# Patient Record
Sex: Female | Born: 1955
Health system: Southern US, Community
[De-identification: ages and names within clinical notes are randomized; demographics above are authoritative.]

## PROBLEM LIST (undated history)

## (undated) DIAGNOSIS — G4700 Insomnia, unspecified: Secondary | ICD-10-CM

## (undated) DIAGNOSIS — F419 Anxiety disorder, unspecified: Secondary | ICD-10-CM

## (undated) DIAGNOSIS — I10 Essential (primary) hypertension: Secondary | ICD-10-CM

## (undated) DIAGNOSIS — K635 Polyp of colon: Secondary | ICD-10-CM

## (undated) DIAGNOSIS — K219 Gastro-esophageal reflux disease without esophagitis: Secondary | ICD-10-CM

## (undated) DIAGNOSIS — R42 Dizziness and giddiness: Secondary | ICD-10-CM

## (undated) HISTORY — DX: Essential (primary) hypertension: I10

## (undated) HISTORY — PX: CHOLECYSTECTOMY: SHX55

## (undated) HISTORY — DX: Insomnia, unspecified: G47.00

## (undated) HISTORY — PX: ROTATOR CUFF REPAIR: SHX139

## (undated) HISTORY — DX: Gastro-esophageal reflux disease without esophagitis: K21.9

## (undated) HISTORY — DX: Anxiety disorder, unspecified: F41.9

## (undated) HISTORY — DX: Dizziness and giddiness: R42

## (undated) HISTORY — DX: Polyp of colon: K63.5

---

## 1999-01-17 HISTORY — PX: ABDOMINAL HYSTERECTOMY: SHX81

## 2011-04-01 ENCOUNTER — Ambulatory Visit (INDEPENDENT_AMBULATORY_CARE_PROVIDER_SITE_OTHER): Payer: BC Managed Care – PPO | Admitting: Internal Medicine

## 2011-04-01 ENCOUNTER — Encounter: Payer: Self-pay | Admitting: Internal Medicine

## 2011-04-01 DIAGNOSIS — R1013 Epigastric pain: Secondary | ICD-10-CM

## 2011-04-01 DIAGNOSIS — K3189 Other diseases of stomach and duodenum: Secondary | ICD-10-CM

## 2011-04-01 DIAGNOSIS — K219 Gastro-esophageal reflux disease without esophagitis: Secondary | ICD-10-CM | POA: Insufficient documentation

## 2011-04-01 DIAGNOSIS — F419 Anxiety disorder, unspecified: Secondary | ICD-10-CM | POA: Insufficient documentation

## 2011-04-01 DIAGNOSIS — G4700 Insomnia, unspecified: Secondary | ICD-10-CM | POA: Insufficient documentation

## 2011-04-01 DIAGNOSIS — K635 Polyp of colon: Secondary | ICD-10-CM | POA: Insufficient documentation

## 2011-04-01 DIAGNOSIS — R42 Dizziness and giddiness: Secondary | ICD-10-CM | POA: Insufficient documentation

## 2011-04-01 DIAGNOSIS — I10 Essential (primary) hypertension: Secondary | ICD-10-CM

## 2011-04-01 DIAGNOSIS — R197 Diarrhea, unspecified: Secondary | ICD-10-CM

## 2011-04-01 DIAGNOSIS — R110 Nausea: Secondary | ICD-10-CM

## 2011-04-01 MED ORDER — HYDROCHLOROTHIAZIDE 25 MG PO TABS: 25.0000 mg | ORAL_TABLET | Freq: Every day | ORAL | Status: DC

## 2011-04-01 MED ORDER — AMLODIPINE BESYLATE 5 MG PO TABS: 5.0000 mg | ORAL_TABLET | Freq: Every day | ORAL | Status: DC

## 2011-04-01 NOTE — Patient Instructions (Signed)
Eliminate dairy, milk, cheese, ice cream, creme cheese,  For the next 4 weeks Will change meds to Norvasc 5 mg and HCTZ 25 mg take each tab once a day RX to pharmacy  Labs will be mailed to you

## 2011-04-01 NOTE — Progress Notes (Signed)
Subjective:    Patient ID: Grace Vaughn, female    DOB: 07-07-1956, 55 y.o.   MRN: [id number]  HPI  New pt here for first visit.  Former pt of [employer].  Works in Consulting civil engineer at [employer].   Saw multiple MD's.  PMH of HTN, menopause, anxiety, GERD, vertigo, colon polyps, and insomnia.    She is concerned today about persistant nausea, occurs after eating but not all foods.sometimes associaated with epigastric cramping and diarrhea.  No abd pain except cramps, no fever.  Diarrhea comes and goes.  She does admit to a high dairy diet at [facility] on dairy days.  Also reports that years ago she had an EGD and was told her esophagus was narrow but it was never stretched.  She is under a lot of stress at work as a co-worker is out ill.  No blood or change in color of stools  She also states her Benicar/hctz is too expensive and would liket to switch.  Sheis intolerant of lisinopril an losartan.  No chest pain Sob, or edema  Allergies  Allergen Reactions  . Erythromycin Nausea And Vomiting  . Keflex Nausea And Vomiting   Past Medical History  Diagnosis Date  . GERD (gastroesophageal reflux disease)   . Hypertension    Past Surgical History  Procedure Date  . Abdominal hysterectomy   . Cesarean section 1986, 1989   History   Social History  . Marital Status: Married    Spouse Name: N/A    Number of Children: N/A  . Years of Education: N/A   Occupational History  . Not on file.   Social History Main Topics  . Smoking status: Never Smoker   . Smokeless tobacco: Never Used  . Alcohol Use: 1.5 oz/week    3 drink(s) per week  . Drug Use: No  . Sexually Active: Yes   Other Topics Concern  . Not on file   Social History Narrative  . No narrative on file   Family History  Problem Relation Age of Onset  . Breast cancer Mother   . Heart disease Mother     cardiomyopathy  . Heart disease Father     CHF   There is no problem list on file for this patient.  No current outpatient  prescriptions on file prior to visit.       Review of Systems    see HPI Objective:   Physical Exam Physical Exam  Nursing note and vitals reviewed.  Constitutional: She is oriented to person, place, and time. She appears well-developed and well-nourished.  HENT:  Head: Normocephalic and atraumatic.  Cardiovascular: Normal rate and regular rhythm. Exam reveals no gallop and no friction rub.  No murmur heard.  Pulmonary/Chest: Breath sounds normal. She has no wheezes. She has no rales. ABD:  BS+  No HSM or masses.  No peritoneal signs  Neurological: She is alert and oriented to person, place, and time.  Skin: Skin is warm and dry.  Psychiatric: She has a normal mood and affect. Her behavior is normal.       Assessment & Plan:  1) Nausea, diarhea, abd cramping.     Will try trial of strict dairy free diet for the next 4 weeks.   Contineu Prilosec.  If this does not work , will need repeat EGD and imaging.  Check labs today  2)  Htn  D/C  Benicar.  Rx:  Norvasc 5 mg #30 with 2 rf.  HCTZ  #  30 3) GERD 4) colon polyps 5) h/o vertigo insomnia

## 2011-04-02 LAB — COMPREHENSIVE METABOLIC PANEL
ALT: 13 U/L (ref 0–35)
AST: 17 U/L (ref 0–37)
Creat: 0.72 mg/dL (ref 0.50–1.10)
Sodium: 139 mEq/L (ref 135–145)
Total Bilirubin: 0.3 mg/dL (ref 0.3–1.2)
Total Protein: 7.2 g/dL (ref 6.0–8.3)

## 2011-04-02 LAB — CBC WITH DIFFERENTIAL/PLATELET
Basophils Absolute: 0.1 10*3/uL (ref 0.0–0.1)
Basophils Relative: 1 % (ref 0–1)
Eosinophils Absolute: 0.1 10*3/uL (ref 0.0–0.7)
Eosinophils Relative: 0 % (ref 0–5)
HCT: 38.8 % (ref 36.0–46.0)
MCH: 29.6 pg (ref 26.0–34.0)
MCHC: 33 g/dL (ref 30.0–36.0)
MCV: 89.8 fL (ref 78.0–100.0)
Monocytes Absolute: 0.8 10*3/uL (ref 0.1–1.0)
RDW: 13.6 % (ref 11.5–15.5)

## 2011-04-07 ENCOUNTER — Encounter: Payer: Self-pay | Admitting: Emergency Medicine

## 2011-04-29 ENCOUNTER — Ambulatory Visit (INDEPENDENT_AMBULATORY_CARE_PROVIDER_SITE_OTHER): Payer: BC Managed Care – PPO | Admitting: Internal Medicine

## 2011-04-29 DIAGNOSIS — G4700 Insomnia, unspecified: Secondary | ICD-10-CM

## 2011-04-29 DIAGNOSIS — R109 Unspecified abdominal pain: Secondary | ICD-10-CM

## 2011-04-29 DIAGNOSIS — I10 Essential (primary) hypertension: Secondary | ICD-10-CM

## 2011-04-29 MED ORDER — CLONAZEPAM 0.5 MG PO TABS: 0.5000 mg | ORAL_TABLET | Freq: Every evening | ORAL | Status: DC | PRN

## 2011-04-29 NOTE — Patient Instructions (Signed)
Will schedule abd and pelvic CT  Call office Wednesday for results  Call office when you check your BP meds

## 2011-04-29 NOTE — Progress Notes (Signed)
Subjective:    Patient ID: Grace Vaughn, female    DOB: [birth year], 55 y.o.   MRN: [id number]  HPI Grace Vaughn is here for follow up.  She has changed her diet to virtually dairy free and has avoided fried foods but still has episodes of nausea and occasional RUQ pain.  She has lost another 5 #.   No documented fever.    She could not tolerate the NOrvasc.  She had numbness and went back to Benicar.  Her numbness is being evaluated by Dr. Antonietta Barcelona.  She reports having a brain MRI which was OK per her report.  She is intolerant to many BP meds and one was causing chest pain  Allergies  Allergen Reactions  . Erythromycin Nausea And Vomiting  . Keflex Nausea And Vomiting  . Lisinopril Cough  . Losartan     Pain in extremities, paresthesias    Past Medical History  Diagnosis Date  . GERD (gastroesophageal reflux disease)   . Anxiety   . Vertigo   . Colon polyps   . Insomnia   . Hypertension    Past Surgical History  Procedure Date  . Cesarean section 1986, 1989  . Abdominal hysterectomy 01/1999   History   Social History  . Marital Status: Married    Spouse Name: N/A    Number of Children: N/A  . Years of Education: N/A   Occupational History  . Not on file.   Social History Main Topics  . Smoking status: Never Smoker   . Smokeless tobacco: Never Used  . Alcohol Use: 1.5 oz/week    3 drink(s) per week  . Drug Use: No  . Sexually Active: Yes   Other Topics Concern  . Not on file   Social History Narrative  . No narrative on file   Family History  Problem Relation Age of Onset  . Breast cancer Mother   . Heart disease Mother     cardiomyopathy  . Heart disease Father     CHF   Patient Active Problem List  Diagnoses  . GERD (gastroesophageal reflux disease)  . Hypertension  . Anxiety  . Vertigo  . Colon polyps  . Insomnia   Current Outpatient Prescriptions on File Prior to Visit  Medication Sig Dispense Refill  . buPROPion (WELLBUTRIN SR) 150 MG 12 hr  tablet Take 150 mg by mouth daily.        Marland Kitchen estradiol (ESTRACE) 0.5 MG tablet Take 0.5 mg by mouth daily.        . hydrochlorothiazide (HYDRODIURIL) 25 MG tablet Take 1 tablet (25 mg total) by mouth daily.  30 tablet  3  . omeprazole (PRILOSEC) 40 MG capsule Take 40 mg by mouth daily.        Marland Kitchen amLODipine (NORVASC) 5 MG tablet Take 1 tablet (5 mg total) by mouth daily.  30 tablet  3       Review of Systems See HPI    Objective:   Physical Exam Physical Exam  Nursing note and vitals reviewed.  Constitutional: She is oriented to person, place, and time. She appears well-developed and well-nourished.  HENT:  Head: Normocephalic and atraumatic.  Cardiovascular: Normal rate and regular rhythm. Exam reveals no gallop and no friction rub.  No murmur heard.  Pulmonary/Chest: Breath sounds normal. She has no wheezes. She has no rales. ABD:  Non distended BS +  Minimal tenderness RUQ only.  No peritoneal signs  Neurological: She is alert and oriented to person,  place, and time.  Skin: Skin is warm and dry.  Psychiatric: She has a normal mood and affect. Her behavior is normal.          Assessment & Plan:  1)  Abd pain, weight loss and nausea  Will get abd/pelvic CT and further Tx based on results.  If normal will need GI consult for endoscopy 2)  HTN:  Pt is to check what meds she has tried in the past  And  Call office before I change anything  Pt is to call me WEds after CT done  She voices understanding

## 2011-05-04 ENCOUNTER — Other Ambulatory Visit (HOSPITAL_BASED_OUTPATIENT_CLINIC_OR_DEPARTMENT_OTHER): Payer: BC Managed Care – PPO

## 2011-05-11 ENCOUNTER — Telehealth: Payer: Self-pay | Admitting: Internal Medicine

## 2011-05-11 DIAGNOSIS — R110 Nausea: Secondary | ICD-10-CM

## 2011-05-11 MED ORDER — PROMETHAZINE HCL 12.5 MG PO TABS: 12.5000 mg | ORAL_TABLET | Freq: Four times a day (QID) | ORAL | Status: DC | PRN

## 2011-05-11 NOTE — Telephone Encounter (Signed)
Spoke with pt . And informed of results.  Ct only shows diverticulosis.  Pt still has significant nausea.  Will refer to GI.  Pt requests Dr. Lanae Boast  Will also give low dose Phenergan q6h prn Counseled not to take if driving due to possible drowsiness

## 2011-05-11 NOTE — Progress Notes (Signed)
Grace Vaughn has an appointment with Dr. Letitia Libra at [facility] 05/17/11 @ 815am.  She is aware of appointment per A. Davis-dhp, rn

## 2011-05-19 ENCOUNTER — Encounter: Payer: Self-pay | Admitting: Emergency Medicine

## 2011-06-07 ENCOUNTER — Telehealth: Payer: Self-pay | Admitting: Emergency Medicine

## 2011-06-07 NOTE — Telephone Encounter (Signed)
Pt called to let you know she is scheduled for gallbladder surgery 06/16/11 with Dr. Darla Lesches [facility]).  They found gallstones on exam.  She is to have EKG done preop and will have them forward a copy to our office.

## 2011-07-27 ENCOUNTER — Other Ambulatory Visit: Payer: Self-pay | Admitting: Emergency Medicine

## 2011-07-27 DIAGNOSIS — G4700 Insomnia, unspecified: Secondary | ICD-10-CM

## 2011-07-27 MED ORDER — CLONAZEPAM 0.5 MG PO TABS: 0.5000 mg | ORAL_TABLET | Freq: Every evening | ORAL | Status: DC | PRN

## 2011-07-27 NOTE — Telephone Encounter (Signed)
Grace Vaughn called, requested refill on Clonazepam which she uses PRN for sleep.  She has been out for several weeks, is not resting well.  She made an appt to be seen for follow up on Thursday 07/28/10.  Called Rx to MD voicemail at [facility] on [address]

## 2011-07-29 ENCOUNTER — Encounter: Payer: Self-pay | Admitting: Internal Medicine

## 2011-07-29 ENCOUNTER — Ambulatory Visit (INDEPENDENT_AMBULATORY_CARE_PROVIDER_SITE_OTHER): Payer: BC Managed Care – PPO | Admitting: Internal Medicine

## 2011-07-29 DIAGNOSIS — R42 Dizziness and giddiness: Secondary | ICD-10-CM

## 2011-07-29 DIAGNOSIS — R110 Nausea: Secondary | ICD-10-CM | POA: Insufficient documentation

## 2011-07-29 MED ORDER — MECLIZINE HCL 25 MG PO TABS: 25.0000 mg | ORAL_TABLET | Freq: Three times a day (TID) | ORAL | Status: AC | PRN

## 2011-07-29 MED ORDER — PROMETHAZINE HCL 12.5 MG PO TABS: 12.5000 mg | ORAL_TABLET | Freq: Three times a day (TID) | ORAL | Status: DC | PRN

## 2011-07-29 NOTE — Progress Notes (Signed)
Subjective:    Patient ID: Grace Vaughn, female    DOB: 25-Nov-1955, 56 y.o.   MRN: [id number]  HPI  Grace Vaughn is here for follow up on nausea.  She has had lap  cholecystectomy in 11/27 Dr. Darla Lesches and had EGD by D.r Earlene Plater [facility] which essentially normal.  Bx for celiac were negative Gastric bx showed patchy inflammation.  She was counseled to take omeprazole bid per Dr. Earlene Plater  ,  She has not noticed much improvement in nausea but her RUQ pain has improved.  No vomiting but now more gassy and diarrhea post chole.  She is adjusting her diet to low fat and this helps some.    She has been on Wellbutrin and would like to come off.  She was under stress from parents illness and death and does not think she needs it now  She has been having spinning sensation very similar ot her vertigo in the past  She describes her work at [employer] as stressful.  Allergies  Allergen Reactions  . Erythromycin Nausea And Vomiting  . Keflex Nausea And Vomiting  . Lisinopril Cough  . Losartan     Pain in extremities, paresthesias    Past Medical History  Diagnosis Date  . GERD (gastroesophageal reflux disease)   . Anxiety   . Vertigo   . Colon polyps   . Insomnia   . Hypertension    Past Surgical History  Procedure Date  . Cesarean section 1986, 1989  . Abdominal hysterectomy 01/1999   History   Social History  . Marital Status: Married    Spouse Name: N/A    Number of Children: N/A  . Years of Education: N/A   Occupational History  . Not on file.   Social History Main Topics  . Smoking status: Never Smoker   . Smokeless tobacco: Never Used  . Alcohol Use: 1.5 oz/week    3 drink(s) per week  . Drug Use: No  . Sexually Active: Yes   Other Topics Concern  . Not on file   Social History Narrative  . No narrative on file   Family History  Problem Relation Age of Onset  . Breast cancer Mother   . Heart disease Mother     cardiomyopathy  . Heart disease Father     CHF    Patient Active Problem List  Diagnoses  . GERD (gastroesophageal reflux disease)  . Hypertension  . Anxiety  . Vertigo  . Colon polyps  . Insomnia  . Nausea alone   Current Outpatient Prescriptions on File Prior to Visit  Medication Sig Dispense Refill  . buPROPion (WELLBUTRIN SR) 150 MG 12 hr tablet Take 150 mg by mouth daily.        . clonazePAM (KLONOPIN) 0.5 MG tablet Take 1 tablet (0.5 mg total) by mouth at bedtime as needed.  30 tablet  1  . estradiol (ESTRACE) 0.5 MG tablet Take 0.5 mg by mouth daily.        Marland Kitchen omeprazole (PRILOSEC) 40 MG capsule Take 40 mg by mouth daily.              Review of Systems    see HPI Objective:   Physical Exam Physical Exam  Nursing note and vitals reviewed.  Constitutional: She is oriented to person, place, and time. She appears well-developed and well-nourished.  HENT:  Head: Normocephalic and atraumatic.  Tm"S  Minimal serous effusion. Cardiovascular: Normal rate and regular rhythm. Exam reveals no  gallop and no friction rub.  No murmur heard.  Pulmonary/Chest: Breath sounds normal. She has no wheezes. She has no rales.  ABD:  Lap chole incision healing well.  BS.  Nondistended  nontender except mild tenderness aat incision sites Neurological: She is alert and oriented to person, place, and time.  Skin: Skin is warm and dry.  Psychiatric: She has a normal mood and affect. Her behavior is normal. Neurologic nonc focal  Cn II - XII intact.  Motor 5/5  Reflexes 2+ symmetric        Assessment & Plan:  Nausea  Etiology unclear.  Will try empiric taper off WEllbutrin  Take 150 mg qod for 10 days then stop. Po low dose Phernergan 12.5 mg po 2) Post chole diarrhea  Low fat diet OK to take immordium prn 3)  Vertigo  Antivert q8h prn

## 2011-07-29 NOTE — Patient Instructions (Signed)
Taper downward off Wellburtirn as instructed  Phenergan as needed  Antivert as needed  Call if dizziness is worse

## 2011-09-08 ENCOUNTER — Ambulatory Visit (INDEPENDENT_AMBULATORY_CARE_PROVIDER_SITE_OTHER): Payer: BC Managed Care – PPO | Admitting: Internal Medicine

## 2011-09-08 ENCOUNTER — Other Ambulatory Visit: Payer: Self-pay | Admitting: Internal Medicine

## 2011-09-08 ENCOUNTER — Encounter: Payer: Self-pay | Admitting: Internal Medicine

## 2011-09-08 DIAGNOSIS — N649 Disorder of breast, unspecified: Secondary | ICD-10-CM

## 2011-09-08 DIAGNOSIS — Z85828 Personal history of other malignant neoplasm of skin: Secondary | ICD-10-CM

## 2011-09-08 MED ORDER — AMOXICILLIN-POT CLAVULANATE 875-125 MG PO TABS: 1.0000 | ORAL_TABLET | Freq: Two times a day (BID) | ORAL | Status: AC

## 2011-09-08 NOTE — Progress Notes (Signed)
Subjective:    Patient ID: Grace Vaughn, female    DOB: 1956/07/08, 56 y.o.   MRN: [id number]  HPI  Alexia Freestone is here with an acute concern.  She has a skin lesion on her L breast present for 3 weeks.  Started when she scratched her breast and noted wet fingers  No purulence noted.  She denies tingling paresthesia, itching or pain.  Has not healed and this concerns her.  Mother had breast CA.    She also has a history of non melanoma skin  CA per her report.    She also report she feels as if she has a sinus infection.  Nasal stufffness, green discharge, pain in frontal sinus.  NO fever  Allergies  Allergen Reactions  . Augmentin (Amoxicillin-Pot Clavulanate)   . Erythromycin Nausea And Vomiting  . Keflex Nausea And Vomiting  . Lisinopril Cough  . Losartan     Pain in extremities, paresthesias    Past Medical History  Diagnosis Date  . GERD (gastroesophageal reflux disease)   . Anxiety   . Vertigo   . Colon polyps   . Insomnia   . Hypertension    Past Surgical History  Procedure Date  . Cesarean section 1986, 1989  . Abdominal hysterectomy 01/1999   History   Social History  . Marital Status: Married    Spouse Name: N/A    Number of Children: N/A  . Years of Education: N/A   Occupational History  . Not on file.   Social History Main Topics  . Smoking status: Never Smoker   . Smokeless tobacco: Never Used  . Alcohol Use: 1.5 oz/week    3 drink(s) per week  . Drug Use: No  . Sexually Active: Yes   Other Topics Concern  . Not on file   Social History Narrative  . No narrative on file   Family History  Problem Relation Age of Onset  . Breast cancer Mother   . Heart disease Mother     cardiomyopathy  . Heart disease Father     CHF   Patient Active Problem List  Diagnoses  . GERD (gastroesophageal reflux disease)  . Hypertension  . Anxiety  . Vertigo  . Colon polyps  . Insomnia  . Nausea alone   Current Outpatient Prescriptions on File Prior to  Visit  Medication Sig Dispense Refill  . buPROPion (WELLBUTRIN SR) 150 MG 12 hr tablet Take 150 mg by mouth daily.        . clonazePAM (KLONOPIN) 0.5 MG tablet Take 1 tablet (0.5 mg total) by mouth at bedtime as needed.  30 tablet  1  . estradiol (ESTRACE) 0.5 MG tablet Take 0.5 mg by mouth daily.        Marland Kitchen olmesartan-hydrochlorothiazide (BENICAR HCT) 40-25 MG per tablet Take 1 tablet by mouth daily.      Marland Kitchen omeprazole (PRILOSEC) 40 MG capsule Take 40 mg by mouth 2 (two) times daily.               Review of Systems    see HPI Objective:   Physical Exam Physical Exam  Constitutional: She is oriented to person, place, and time. She appears well-developed and well-nourished. She is cooperative.  HENT:  Head: Normocephalic and atraumatic.  Right Ear: A middle ear effusion is present.  Left Ear: A middle ear effusion is present.  Nose: Mucosal edema present. Right sinus exhibits maxillary sinus tenderness. Left sinus exhibits maxillary sinus tenderness.  Mouth/Throat: Posterior  oropharyngeal erythema present.  Serous effusion bilaterally  Eyes: Conjunctivae and EOM are normal. Pupils are equal, round, and reactive to light.  Neck: Neck supple. Carotid bruit is not present. No mass present.  Cardiovascular: Regular rhythm, normal heart sounds, intact distal pulses and normal pulses. Exam reveals no gallop and no friction rub.  No murmur heard.  Pulmonary/Chest: Breath sounds normal. She has no wheezes. She has no rhonchi. She has no rales.   Breast exam.  No nipple discharge , no discrete masses no axillary adenopathy bilaterally.  She has a singular  0.5 cm nummular shaped scab medial lower L breast.  No purulence.  No mass palpated under area. Neurological: She is alert and oriented to person, place, and time.  Skin: Skin is warm and dry. No abrasion, no bruising, no ecchymosis and no rash noted. No cyanosis. Nails show no clubbing.  Psychiatric: She has a normal mood and affect. Her  speech is normal and behavior is normal.        Assessment & Plan:  1) Skin lesion L breast.  Will give 10 day course of augmentin to be taken with food to avoid nausea.  Will also get diagnostic mammogram.  Clinically does not fit a picture of zoster.  She is to see me in 4 weeks.  She voices understanding 2)   Sinusitis  See above 3)  History of skin ca  Has not seen a dermatologist for skin surveillance.  Will refer to Dr. Danella Deis

## 2011-09-08 NOTE — Patient Instructions (Signed)
To have diagnostic mammogram will be scheduled  Take antibiotic as prescribed  See me in 4 weeks  Will set up referral to dermatolgy

## 2011-09-10 ENCOUNTER — Encounter: Payer: Self-pay | Admitting: Internal Medicine

## 2011-09-16 ENCOUNTER — Ambulatory Visit
Admission: RE | Admit: 2011-09-16 | Discharge: 2011-09-16 | Disposition: A | Discharge status: Home / Self Care | Payer: BC Managed Care – PPO | Source: Ambulatory Visit | Attending: Internal Medicine | Admitting: Internal Medicine

## 2011-09-16 DIAGNOSIS — N649 Disorder of breast, unspecified: Secondary | ICD-10-CM

## 2011-10-07 ENCOUNTER — Ambulatory Visit: Payer: BC Managed Care – PPO | Admitting: Internal Medicine

## 2012-07-25 ENCOUNTER — Telehealth: Payer: Self-pay | Admitting: *Deleted

## 2012-08-03 NOTE — Telephone Encounter (Signed)
Verified meds.

## 2012-08-22 ENCOUNTER — Telehealth: Payer: Self-pay | Admitting: Internal Medicine

## 2012-08-22 NOTE — Telephone Encounter (Signed)
Pt states she is having chills, body aches, diarrhea, headaches in the middle... Pt would like to know what she should do... Pt would like to know if a nurse can call her at home [phone]...   Pt has appointment on Thursday afternoon.Marland Kitchen

## 2012-08-22 NOTE — Telephone Encounter (Signed)
Advised pt to go to UC as well as keep her appt. On Thur

## 2012-08-24 ENCOUNTER — Ambulatory Visit: Payer: BC Managed Care – PPO | Admitting: Internal Medicine

## 2012-08-29 ENCOUNTER — Ambulatory Visit (INDEPENDENT_AMBULATORY_CARE_PROVIDER_SITE_OTHER): Payer: BC Managed Care – PPO | Admitting: Internal Medicine

## 2012-08-29 ENCOUNTER — Encounter: Payer: Self-pay | Admitting: Internal Medicine

## 2012-08-29 VITALS — BP 135/82 | HR 85 | Temp 97.7°F | Resp 18 | Wt 189.0 lb

## 2012-08-29 DIAGNOSIS — K5289 Other specified noninfective gastroenteritis and colitis: Secondary | ICD-10-CM

## 2012-08-29 DIAGNOSIS — R51 Headache: Secondary | ICD-10-CM

## 2012-08-29 DIAGNOSIS — R519 Headache, unspecified: Secondary | ICD-10-CM | POA: Insufficient documentation

## 2012-08-29 DIAGNOSIS — R110 Nausea: Secondary | ICD-10-CM

## 2012-08-29 LAB — POCT URINALYSIS DIPSTICK
Bilirubin, UA: NEGATIVE
Blood, UA: NEGATIVE
Glucose, UA: NEGATIVE
Ketones, UA: NEGATIVE
Spec Grav, UA: 1.015
Urobilinogen, UA: NEGATIVE

## 2012-08-29 MED ORDER — RIZATRIPTAN BENZOATE 10 MG PO TBDP: 10.0000 mg | ORAL_TABLET | ORAL | Status: DC | PRN

## 2012-08-29 NOTE — Patient Instructions (Addendum)
See me in two weeks 

## 2012-08-29 NOTE — Progress Notes (Signed)
Subjective:    Patient ID: Grace Vaughn, female    DOB: 1956-06-09, 57 y.o.   MRN: [id number]  HPI Grace Vaughn is here for acute visit.   She reports 2 week history of frontal headache and Pain "behind my R eye"   Associated with nausea and photophobia.  No vomiting  Wants "to sleep it off"   No FHof migraines.    On 2/4  She was seen at [facility] urgent care for gastroenteritis.  Had severe vomiting and diarrhea.  Diagnsoed with infectious gastroenteritis and given phenergan, ultram for pain and Cipro.  She has just a few Cipro left.  Vomiting and diarrhea has subsided.   Exposed to husband with similar GI illness  What persists is nausea an dull headache that occurs in am over the last two days  Allergies  Allergen Reactions  . Augmentin (Amoxicillin-Pot Clavulanate)   . Cephalexin Nausea And Vomiting  . Erythromycin Nausea And Vomiting  . Lisinopril Cough  . Losartan     Pain in extremities, paresthesias    Past Medical History  Diagnosis Date  . GERD (gastroesophageal reflux disease)   . Anxiety   . Vertigo   . Colon polyps   . Insomnia   . Hypertension    Past Surgical History  Procedure Laterality Date  . Cesarean section  1986, 1989  . Abdominal hysterectomy  01/1999   History   Social History  . Marital Status: Married    Spouse Name: N/A    Number of Children: N/A  . Years of Education: N/A   Occupational History  . Not on file.   Social History Main Topics  . Smoking status: Never Smoker   . Smokeless tobacco: Never Used  . Alcohol Use: 1.5 oz/week    3 drink(s) per week  . Drug Use: No  . Sexually Active: Yes   Other Topics Concern  . Not on file   Social History Narrative  . No narrative on file   Family History  Problem Relation Age of Onset  . Breast cancer Mother   . Heart disease Mother     cardiomyopathy  . Heart disease Father     CHF   Patient Active Problem List  Diagnosis  . GERD (gastroesophageal reflux disease)  .  Hypertension  . Anxiety  . Vertigo  . Colon polyps  . Insomnia  . Nausea alone  . History of skin cancer  . Headache   Current Outpatient Prescriptions on File Prior to Visit  Medication Sig Dispense Refill  . buPROPion (WELLBUTRIN SR) 150 MG 12 hr tablet Take 150 mg by mouth daily.        Marland Kitchen estradiol (ESTRACE) 0.5 MG tablet Take 0.5 mg by mouth daily.        Marland Kitchen olmesartan-hydrochlorothiazide (BENICAR HCT) 40-25 MG per tablet Take 1 tablet by mouth daily.      Marland Kitchen omeprazole (PRILOSEC) 40 MG capsule Take 40 mg by mouth 2 (two) times daily.       . clonazePAM (KLONOPIN) 0.5 MG tablet Take 1 tablet (0.5 mg total) by mouth at bedtime as needed.  30 tablet  1  . promethazine (PHENERGAN) 12.5 MG tablet Take 1 tablet (12.5 mg total) by mouth every 6 (six) hours as needed for nausea.  30 tablet  0  . promethazine (PHENERGAN) 12.5 MG tablet Take 1 tablet (12.5 mg total) by mouth every 8 (eight) hours as needed for nausea.  10 tablet  0   No current  facility-administered medications on file prior to visit.       Review of Systems See HPI    Objective:   Physical Exam  Physical Exam  Nursing note and vitals reviewed.  Constitutional: She is oriented to person, place, and time. She appears well-developed and well-nourished.  HENT:  Head: Normocephalic and atraumatic.  Cardiovascular: Normal rate and regular rhythm. Exam reveals no gallop and no friction rub.  No murmur heard.  Pulmonary/Chest: Breath sounds normal. She has no wheezes. She has no rales.  Neurological: She is alert and oriented to person, place, and time. CNII-XII intact Motor 5/5 UE and LE Reflexes 2+ intact Romberg neg Cerebellar intact to FTN Sensory intact to pinprick  Skin: Skin is warm and dry.  Psychiatric: She has a normal mood and affect. Her behavior is normal.         Assessment & Plan:  Headache:  Clinically consistant with migraine syndrome.   Will try Maxalt 10 mg odt.  Counseled of SE profile  OK  to repeat in 2 hours.  She is to see me in two weeks.    Recent Gastroenteritis:  Resolving  Persistant nausea  See above.  Return in two weeks or sooner as needed

## 2012-09-27 ENCOUNTER — Ambulatory Visit (INDEPENDENT_AMBULATORY_CARE_PROVIDER_SITE_OTHER): Payer: BC Managed Care – PPO | Admitting: Internal Medicine

## 2012-09-27 ENCOUNTER — Ambulatory Visit (HOSPITAL_BASED_OUTPATIENT_CLINIC_OR_DEPARTMENT_OTHER)
Admission: RE | Admit: 2012-09-27 | Discharge: 2012-09-27 | Disposition: A | Discharge status: Home / Self Care | Payer: BC Managed Care – PPO | Source: Ambulatory Visit | Attending: Internal Medicine | Admitting: Internal Medicine

## 2012-09-27 ENCOUNTER — Encounter: Payer: Self-pay | Admitting: Internal Medicine

## 2012-09-27 VITALS — BP 128/76 | HR 97 | Temp 97.0°F | Resp 16 | Ht 64.0 in | Wt 189.0 lb

## 2012-09-27 DIAGNOSIS — Z1231 Encounter for screening mammogram for malignant neoplasm of breast: Secondary | ICD-10-CM | POA: Insufficient documentation

## 2012-09-27 DIAGNOSIS — R51 Headache: Secondary | ICD-10-CM

## 2012-09-27 DIAGNOSIS — K635 Polyp of colon: Secondary | ICD-10-CM

## 2012-09-27 DIAGNOSIS — Z139 Encounter for screening, unspecified: Secondary | ICD-10-CM

## 2012-09-27 DIAGNOSIS — I10 Essential (primary) hypertension: Secondary | ICD-10-CM

## 2012-09-27 DIAGNOSIS — K219 Gastro-esophageal reflux disease without esophagitis: Secondary | ICD-10-CM

## 2012-09-27 DIAGNOSIS — F419 Anxiety disorder, unspecified: Secondary | ICD-10-CM

## 2012-09-27 DIAGNOSIS — D126 Benign neoplasm of colon, unspecified: Secondary | ICD-10-CM

## 2012-09-27 DIAGNOSIS — F411 Generalized anxiety disorder: Secondary | ICD-10-CM

## 2012-09-27 DIAGNOSIS — R197 Diarrhea, unspecified: Secondary | ICD-10-CM

## 2012-09-27 LAB — COMPREHENSIVE METABOLIC PANEL
BUN: 24 mg/dL — ABNORMAL HIGH (ref 6–23)
CO2: 27 mEq/L (ref 19–32)
Calcium: 9.3 mg/dL (ref 8.4–10.5)
Chloride: 102 mEq/L (ref 96–112)
Creat: 1.17 mg/dL — ABNORMAL HIGH (ref 0.50–1.10)
Total Bilirubin: 0.2 mg/dL — ABNORMAL LOW (ref 0.3–1.2)

## 2012-09-27 LAB — LIPID PANEL
Cholesterol: 189 mg/dL (ref 0–200)
HDL: 65 mg/dL (ref >39)
Total CHOL/HDL Ratio: 2.9 Ratio
VLDL: 28 mg/dL (ref 0–40)

## 2012-09-27 LAB — POCT URINALYSIS DIPSTICK
Bilirubin, UA: NEGATIVE
Blood, UA: NEGATIVE
Glucose, UA: NEGATIVE
Leukocytes, UA: NEGATIVE
Nitrite, UA: NEGATIVE
Urobilinogen, UA: NEGATIVE
pH, UA: 5.5

## 2012-09-27 LAB — CBC WITH DIFFERENTIAL/PLATELET
Eosinophils Relative: 1 % (ref 0–5)
Hemoglobin: 11.4 g/dL — ABNORMAL LOW (ref 12.0–15.0)
Lymphocytes Relative: 26 % (ref 12–46)
Lymphs Abs: 2.8 10*3/uL (ref 0.7–4.0)
MCH: 29 pg (ref 26.0–34.0)
MCV: 85.8 fL (ref 78.0–100.0)
Monocytes Relative: 8 % (ref 3–12)
Platelets: 356 10*3/uL (ref 150–400)
RBC: 3.93 MIL/uL (ref 3.87–5.11)
WBC: 11 10*3/uL — ABNORMAL HIGH (ref 4.0–10.5)

## 2012-09-28 ENCOUNTER — Encounter: Payer: Self-pay | Admitting: Internal Medicine

## 2012-09-28 LAB — TSH: TSH: 2.759 u[IU]/mL (ref 0.350–4.500)

## 2012-09-28 LAB — GLIA (IGA/G) + TTG IGA: Gliadin IgA: 5 U/mL (ref <20)

## 2012-09-28 NOTE — Progress Notes (Signed)
Subjective:    Patient ID: Grace Vaughn, female    DOB: 27-Feb-1956, 57 y.o.   MRN: [id number]  HPI  Grace Vaughn is here for CPE.  She still has problems with chronic diarrhea and occasional nausea.  She did see a GI MD in [place] named Dr. Earlene Vaughn that she did not like.  She apparantly had an upper endoscopy  I do not have report. Per pt reports the upper endoscopy was OK  She does report she gets nauseated when she is anxious or has to perform publicly  Allergies  Allergen Reactions  . Augmentin (Amoxicillin-Pot Clavulanate)   . Cephalexin Nausea And Vomiting  . Erythromycin Nausea And Vomiting  . Lisinopril Cough  . Losartan     Pain in extremities, paresthesias    Past Medical History  Diagnosis Date  . GERD (gastroesophageal reflux disease)   . Anxiety   . Vertigo   . Colon polyps   . Insomnia   . Hypertension    Past Surgical History  Procedure Laterality Date  . Cesarean section  1986, 1989  . Abdominal hysterectomy  01/1999   History   Social History  . Marital Status: Married    Spouse Name: N/A    Number of Children: N/A  . Years of Education: N/A   Occupational History  . Not on file.   Social History Main Topics  . Smoking status: Never Smoker   . Smokeless tobacco: Never Used  . Alcohol Use: 1.5 oz/week    3 drink(s) per week  . Drug Use: No  . Sexually Active: Yes   Other Topics Concern  . Not on file   Social History Narrative  . No narrative on file   Family History  Problem Relation Age of Onset  . Breast cancer Mother   . Heart disease Mother     cardiomyopathy  . Heart disease Father     CHF   Patient Active Problem List  Diagnosis  . GERD (gastroesophageal reflux disease)  . Hypertension  . Anxiety  . Vertigo  . Colon polyps  . Insomnia  . Nausea alone  . History of skin cancer  . Headache   Current Outpatient Prescriptions on File Prior to Visit  Medication Sig Dispense Refill  . buPROPion (WELLBUTRIN SR) 150 MG 12  hr tablet Take 150 mg by mouth daily.        . clonazePAM (KLONOPIN) 0.5 MG tablet Take 1 tablet (0.5 mg total) by mouth at bedtime as needed.  30 tablet  1  . estradiol (ESTRACE) 0.5 MG tablet Take 0.5 mg by mouth daily.        Marland Kitchen olmesartan-hydrochlorothiazide (BENICAR HCT) 40-25 MG per tablet Take 1 tablet by mouth daily.      Marland Kitchen omeprazole (PRILOSEC) 40 MG capsule Take 40 mg by mouth 2 (two) times daily.       . promethazine (PHENERGAN) 25 MG tablet Take 25 mg by mouth every 6 (six) hours as needed for nausea.      . rizatriptan (MAXALT-MLT) 10 MG disintegrating tablet Take 1 tablet (10 mg total) by mouth as needed for migraine. May repeat in 2 hours if needed  10 tablet  0   No current facility-administered medications on file prior to visit.     Review of Systems    see HPI Objective:   Physical Exam  Physical Exam  Nursing note and vitals reviewed.  Constitutional: She is oriented to person, place, and time. She appears  well-developed and well-nourished.  HENT:  Head: Normocephalic and atraumatic.  Right Ear: Tympanic membrane and ear canal normal. No drainage. Tympanic membrane is not injected and not erythematous.  Left Ear: Tympanic membrane and ear canal normal. No drainage. Tympanic membrane is not injected and not erythematous.  Nose: Nose normal. Right sinus exhibits no maxillary sinus tenderness and no frontal sinus tenderness. Left sinus exhibits no maxillary sinus tenderness and no frontal sinus tenderness.  Mouth/Throat: Oropharynx is clear and moist. No oral lesions. No oropharyngeal exudate.  Eyes: Conjunctivae and EOM are normal. Pupils are equal, round, and reactive to light.  Neck: Normal range of motion. Neck supple. No JVD present. Carotid bruit is not present. No mass and no thyromegaly present.  Cardiovascular: Normal rate, regular rhythm, S1 normal, S2 normal and intact distal pulses. Exam reveals no gallop and no friction rub.  No murmur heard.  Pulses:   Carotid pulses are 2+ on the right side, and 2+ on the left side.  Dorsalis pedis pulses are 2+ on the right side, and 2+ on the left side.  No carotid bruit. No LE edema  Pulmonary/Chest: Breath sounds normal. She has no wheezes. She has no rales. She exhibits no tenderness.  Breasts no discrete masses no axillary adenpopathy no nipple discharge bilaterally Abdominal: Soft. Bowel sounds are normal. She exhibits no distension and no mass. There is no hepatosplenomegaly. There is no tenderness. There is no CVA tenderness.  Rectal no mass guaiac neg   Musculoskeletal: Normal range of motion.  No active synovitis to joints.  Lymphadenopathy:  She has no cervical adenopathy.  She has no axillary adenopathy.  Right: No inguinal and no supraclavicular adenopathy present.  Left: No inguinal and no supraclavicular adenopathy present.  Neurological: She is alert and oriented to person, place, and time. She has normal strength and normal reflexes. She displays no tremor. No cranial nerve deficit or sensory deficit. Coordination and gait normal.  Skin: Skin is warm and dry. No rash noted. No cyanosis. Nails show no clubbing.  Psychiatric: She has a normal mood and affect. Her speech is normal and behavior is normal. Cognition and memory are normal.         Assessment & Plan:  Health Maintenance  See scanned sheet.   HTN  Continue meds   Nausea and diarrhea  Pt counseled to follow up with [place] [facility]  Anxiety continue WEllbutrin and klonopin prn  Genella Rife  Continue Prilosec  History of colon polyps

## 2012-09-28 NOTE — Patient Instructions (Addendum)
See me as needed 

## 2012-10-04 ENCOUNTER — Telehealth: Payer: Self-pay | Admitting: Internal Medicine

## 2012-10-04 MED ORDER — POTASSIUM CHLORIDE CRYS ER 10 MEQ PO TBCR: EXTENDED_RELEASE_TABLET | ORAL | Status: DC

## 2012-10-04 NOTE — Telephone Encounter (Signed)
Spoke with pt and informed of lab rsults  Will replace oral K and pt is to see me in 2 weeks. Advised to drink plenty of liquids and I will  rechedk bun/creat

## 2012-11-23 ENCOUNTER — Telehealth: Payer: Self-pay | Admitting: Internal Medicine

## 2012-11-23 NOTE — Telephone Encounter (Signed)
Grace Vaughn  Pt was to see me in office after I gave her replacement K  Please call pt and give her an office visit appt .  Message back with date

## 2012-11-23 NOTE — Telephone Encounter (Signed)
Pt will make an appt on Friday 5/09

## 2012-11-29 ENCOUNTER — Ambulatory Visit (INDEPENDENT_AMBULATORY_CARE_PROVIDER_SITE_OTHER): Payer: PRIVATE HEALTH INSURANCE | Admitting: Internal Medicine

## 2012-11-29 ENCOUNTER — Encounter: Payer: Self-pay | Admitting: Internal Medicine

## 2012-11-29 VITALS — BP 126/79 | HR 84 | Temp 97.4°F | Resp 18 | Wt 192.0 lb

## 2012-11-29 DIAGNOSIS — E876 Hypokalemia: Secondary | ICD-10-CM

## 2012-11-29 DIAGNOSIS — R110 Nausea: Secondary | ICD-10-CM

## 2012-11-29 LAB — COMPREHENSIVE METABOLIC PANEL
BUN: 18 mg/dL (ref 6–23)
CO2: 29 mEq/L (ref 19–32)
Calcium: 10.2 mg/dL (ref 8.4–10.5)
Chloride: 99 mEq/L (ref 96–112)
Creat: 0.95 mg/dL (ref 0.50–1.10)
Total Bilirubin: 0.3 mg/dL (ref 0.3–1.2)

## 2012-11-29 NOTE — Patient Instructions (Addendum)
Call for lab results tommorrow

## 2012-11-29 NOTE — Progress Notes (Signed)
Subjective:    Patient ID: Grace Vaughn, female    DOB: 03/02/1956, 57 y.o.   MRN: [id number]  HPI  Grace Vaughn is here for follow up.  She tells me her diarrhea and nausea has markedly improved.   She has changed her diet to avoid dairy and fat and she thinks this has made a difference  I gave her a short course of K for replacement.  She is off K now for several weeks  Allergies  Allergen Reactions  . Augmentin (Amoxicillin-Pot Clavulanate)   . Cephalexin Nausea And Vomiting  . Erythromycin Nausea And Vomiting  . Lisinopril Cough  . Losartan     Pain in extremities, paresthesias    Past Medical History  Diagnosis Date  . GERD (gastroesophageal reflux disease)   . Anxiety   . Vertigo   . Colon polyps   . Insomnia   . Hypertension    Past Surgical History  Procedure Laterality Date  . Cesarean section  1986, 1989  . Abdominal hysterectomy  01/1999   History   Social History  . Marital Status: Married    Spouse Name: N/A    Number of Children: N/A  . Years of Education: N/A   Occupational History  . Not on file.   Social History Main Topics  . Smoking status: Never Smoker   . Smokeless tobacco: Never Used  . Alcohol Use: 1.5 oz/week    3 drink(s) per week  . Drug Use: No  . Sexually Active: Yes   Other Topics Concern  . Not on file   Social History Narrative  . No narrative on file   Family History  Problem Relation Age of Onset  . Breast cancer Mother   . Heart disease Mother     cardiomyopathy  . Heart disease Father     CHF   Patient Active Problem List   Diagnosis Date Noted  . Hypokalemia 11/29/2012  . Headache 08/29/2012  . History of skin cancer 09/08/2011  . Nausea alone 07/29/2011  . GERD (gastroesophageal reflux disease)   . Hypertension   . Anxiety   . Vertigo   . Colon polyps   . Insomnia    Current Outpatient Prescriptions on File Prior to Visit  Medication Sig Dispense Refill  . buPROPion (WELLBUTRIN SR) 150 MG 12 hr tablet  Take 150 mg by mouth daily.        . clonazePAM (KLONOPIN) 0.5 MG tablet Take 1 tablet (0.5 mg total) by mouth at bedtime as needed.  30 tablet  1  . estradiol (ESTRACE) 0.5 MG tablet Take 0.5 mg by mouth daily.        Marland Kitchen olmesartan-hydrochlorothiazide (BENICAR HCT) 40-25 MG per tablet Take 1 tablet by mouth daily.      Marland Kitchen omeprazole (PRILOSEC) 40 MG capsule Take 40 mg by mouth 2 (two) times daily.       . promethazine (PHENERGAN) 25 MG tablet Take 25 mg by mouth every 6 (six) hours as needed for nausea.      . rizatriptan (MAXALT-MLT) 10 MG disintegrating tablet Take 1 tablet (10 mg total) by mouth as needed for migraine. May repeat in 2 hours if needed  10 tablet  0  . potassium chloride (K-DUR,KLOR-CON) 10 MEQ tablet Take two tablets for one week then one tablet daily  30 tablet  1   No current facility-administered medications on file prior to visit.       Review of Systems See HPI  Objective:   Physical Exam Physical Exam  Nursing note and vitals reviewed.  Constitutional: She is oriented to person, place, and time. She appears well-developed and well-nourished.  HENT:  Head: Normocephalic and atraumatic.  Cardiovascular: Normal rate and regular rhythm. Exam reveals no gallop and no friction rub.  No murmur heard.  Pulmonary/Chest: Breath sounds normal. She has no wheezes. She has no rales.  Neurological: She is alert and oriented to person, place, and time.  Skin: Skin is warm and dry.  Psychiatric: She has a normal mood and affect. Her behavior is normal.             Assessment & Plan:  Hypokalemia  Will check today  Diarrhea/Nausea:  Improved with dietary change

## 2012-11-30 ENCOUNTER — Encounter: Payer: Self-pay | Admitting: *Deleted

## 2012-11-30 NOTE — Progress Notes (Signed)
Called Grace Vaughn and notified her potassium was fine, does not need to take anymore. Patient veralizes understanding.

## 2013-01-29 ENCOUNTER — Other Ambulatory Visit: Payer: Self-pay | Admitting: *Deleted

## 2013-01-29 MED ORDER — OLMESARTAN MEDOXOMIL-HCTZ 40-25 MG PO TABS: 1.0000 | ORAL_TABLET | Freq: Every day | ORAL | Status: DC

## 2013-01-29 NOTE — Telephone Encounter (Signed)
Patient called needs her Benicar HCT 40-25 mg 1/day filled.  She was originally prescribed by her prior doctor before she switched to Dr Kathie Rhodes.  She is on her last pill.  Her pharmacy is [facility]. If you need to talk to her call her at work [phone] ext [phone]

## 2013-01-29 NOTE — Telephone Encounter (Signed)
Refill request

## 2013-04-23 ENCOUNTER — Other Ambulatory Visit: Payer: Self-pay | Admitting: *Deleted

## 2013-04-23 MED ORDER — OLMESARTAN MEDOXOMIL-HCTZ 40-25 MG PO TABS: 1.0000 | ORAL_TABLET | Freq: Every day | ORAL | Status: DC

## 2013-04-23 NOTE — Telephone Encounter (Signed)
Refill request

## 2013-09-19 ENCOUNTER — Ambulatory Visit (HOSPITAL_BASED_OUTPATIENT_CLINIC_OR_DEPARTMENT_OTHER)
Admission: RE | Admit: 2013-09-19 | Discharge: 2013-09-19 | Disposition: A | Discharge status: Home / Self Care | Payer: 59 | Source: Ambulatory Visit | Attending: Internal Medicine | Admitting: Internal Medicine

## 2013-09-19 ENCOUNTER — Ambulatory Visit (INDEPENDENT_AMBULATORY_CARE_PROVIDER_SITE_OTHER): Payer: 59 | Admitting: Internal Medicine

## 2013-09-19 ENCOUNTER — Encounter: Payer: Self-pay | Admitting: Internal Medicine

## 2013-09-19 VITALS — BP 133/82 | HR 88 | Temp 98.1°F | Resp 18 | Wt 196.0 lb

## 2013-09-19 DIAGNOSIS — R109 Unspecified abdominal pain: Secondary | ICD-10-CM

## 2013-09-19 DIAGNOSIS — R319 Hematuria, unspecified: Secondary | ICD-10-CM | POA: Diagnosis present

## 2013-09-19 DIAGNOSIS — R0989 Other specified symptoms and signs involving the circulatory and respiratory systems: Secondary | ICD-10-CM

## 2013-09-19 DIAGNOSIS — R0600 Dyspnea, unspecified: Secondary | ICD-10-CM

## 2013-09-19 DIAGNOSIS — R0609 Other forms of dyspnea: Secondary | ICD-10-CM

## 2013-09-19 LAB — POCT URINALYSIS DIPSTICK
Bilirubin, UA: NEGATIVE
Glucose, UA: NEGATIVE
Ketones, UA: NEGATIVE
Leukocytes, UA: NEGATIVE
NITRITE UA: NEGATIVE
PROTEIN UA: NEGATIVE
Spec Grav, UA: 1.02
UROBILINOGEN UA: NEGATIVE
pH, UA: 6

## 2013-09-19 MED ORDER — HYDROCODONE-ACETAMINOPHEN 7.5-300 MG PO TABS: ORAL_TABLET | ORAL | Status: DC

## 2013-09-19 NOTE — Progress Notes (Addendum)
Subjective:    Patient ID: Grace Vaughn, female    DOB: 11-02-1955, 58 y.o.   MRN: [id number]  HPI Grace Vaughn is here for acute visit.  She has had intermittant R flank pain that began 3 weeks ago.  No injury or trauma.  At times associated with nausea.  Does not happpen daily.  Pain so severe it woke pt up last night.  Occasional SOB  Pain radiates to RUQ.  She is S/P cholecystectomy.  NO fever no cough, no dysuria no urinary urgency.    She went to [facility] yesterday and was told it was costochondritis.    Allergies  Allergen Reactions  . Augmentin [Amoxicillin-Pot Clavulanate]   . Cephalexin Nausea And Vomiting  . Erythromycin Nausea And Vomiting  . Lisinopril Cough  . Losartan     Pain in extremities, paresthesias    Past Medical History  Diagnosis Date  . GERD (gastroesophageal reflux disease)   . Anxiety   . Vertigo   . Colon polyps   . Insomnia   . Hypertension    Past Surgical History  Procedure Laterality Date  . Cesarean section  1986, 1989  . Abdominal hysterectomy  01/1999   History   Social History  . Marital Status: Married    Spouse Name: N/A    Number of Children: N/A  . Years of Education: N/A   Occupational History  . Not on file.   Social History Main Topics  . Smoking status: Never Smoker   . Smokeless tobacco: Never Used  . Alcohol Use: 1.5 oz/week    3 drink(s) per week  . Drug Use: No  . Sexual Activity: Yes   Other Topics Concern  . Not on file   Social History Narrative  . No narrative on file   Family History  Problem Relation Age of Onset  . Breast cancer Mother   . Heart disease Mother     cardiomyopathy  . Heart disease Father     CHF   Patient Active Problem List   Diagnosis Date Noted  . Hypokalemia 11/29/2012  . Headache 08/29/2012  . History of skin cancer 09/08/2011  . Nausea alone 07/29/2011  . GERD (gastroesophageal reflux disease)   . Hypertension   . Anxiety   . Vertigo   . Colon polyps   .  Insomnia    Current Outpatient Prescriptions on File Prior to Visit  Medication Sig Dispense Refill  . buPROPion (WELLBUTRIN SR) 150 MG 12 hr tablet Take 150 mg by mouth daily.        Marland Kitchen estradiol (ESTRACE) 0.5 MG tablet Take 0.5 mg by mouth daily.        Marland Kitchen olmesartan-hydrochlorothiazide (BENICAR HCT) 40-25 MG per tablet Take 1 tablet by mouth daily.  90 tablet  1  . clonazePAM (KLONOPIN) 0.5 MG tablet Take 1 tablet (0.5 mg total) by mouth at bedtime as needed.  30 tablet  1  . omeprazole (PRILOSEC) 40 MG capsule Take 40 mg by mouth 2 (two) times daily.        No current facility-administered medications on file prior to visit.       Review of Systems    See HPI Objective:   Physical Exam  Physical Exam  Nursing note and vitals reviewed.  Constitutional: She is oriented to person, place, and time. She appears well-developed and well-nourished.  HENT:  Head: Normocephalic and atraumatic.  Cardiovascular: Normal rate and regular rhythm. Exam reveals no gallop and no  friction rub.  No murmur heard.  Pulmonary/Chest: Breath sounds normal. She has no wheezes. She has no rales. Abd   CVA tenderness R side.   Abd soft BS+  No HSM  No rebound or referred rebound  Neurological: She is alert and oriented to person, place, and time.  Skin: Skin is warm and dry.  Psychiatric: She has a normal mood and affect. Her behavior is normal.       Assessment & Plan:  Flank pain / hematuria   U/A mod blood  Will get stat CT for stone protocol.   CBC, chemistry  Amylase today   Further management based on resulst    For pain control  Will give vicodin 7.5/300 one every 8 hours prn   SOB   Will also get CXR   Addendum : 3/5  WBC is elevated due to prednisone she was given at [facility].  She was given steroid and Flexeril  Addendum  3/5  I spoke with Dr. Risa Grill to review CT scan.  He states the calcification is so small in R kidney that it should not be causing pain. Lots of pelvic calcifications,   Phleboliths or ?? Stone that could be in ureter but no hydronephrosis.     Addendum 3/5  1615:  I spoke with both pt and her husband Grace Vaughn and counseled them if pain returns she is to come to ER.  OK to continue steroid Use Flexeril at her discretion.   She is to see me in office on Monday

## 2013-09-19 NOTE — Patient Instructions (Signed)
To xray and labs

## 2013-09-20 LAB — COMPREHENSIVE METABOLIC PANEL
ALBUMIN: 4.2 g/dL (ref 3.5–5.2)
ALT: 15 U/L (ref 0–35)
AST: 16 U/L (ref 0–37)
Alkaline Phosphatase: 65 U/L (ref 39–117)
BUN: 21 mg/dL (ref 6–23)
CO2: 23 mEq/L (ref 19–32)
Calcium: 9.9 mg/dL (ref 8.4–10.5)
Chloride: 101 mEq/L (ref 96–112)
Creat: 0.91 mg/dL (ref 0.50–1.10)
GLUCOSE: 85 mg/dL (ref 70–99)
Sodium: 133 mEq/L — ABNORMAL LOW (ref 135–145)
Total Bilirubin: 0.2 mg/dL (ref 0.2–1.2)
Total Protein: 7.1 g/dL (ref 6.0–8.3)

## 2013-09-20 LAB — CBC WITH DIFFERENTIAL/PLATELET
Basophils Absolute: 0 10*3/uL (ref 0.0–0.1)
Basophils Relative: 0 % (ref 0–1)
Eosinophils Absolute: 0 10*3/uL (ref 0.0–0.7)
Eosinophils Relative: 0 % (ref 0–5)
HEMATOCRIT: 36.8 % (ref 36.0–46.0)
HEMOGLOBIN: 12.5 g/dL (ref 12.0–15.0)
LYMPHS PCT: 15 % (ref 12–46)
Lymphs Abs: 2.7 10*3/uL (ref 0.7–4.0)
MCH: 29.2 pg (ref 26.0–34.0)
MCHC: 34 g/dL (ref 30.0–36.0)
MCV: 86 fL (ref 78.0–100.0)
MONOS PCT: 9 % (ref 3–12)
Monocytes Absolute: 1.6 10*3/uL — ABNORMAL HIGH (ref 0.1–1.0)
NEUTROS ABS: 13.7 10*3/uL — AB (ref 1.7–7.7)
Neutrophils Relative %: 76 % (ref 43–77)
Platelets: 391 10*3/uL (ref 150–400)
RBC: 4.28 MIL/uL (ref 3.87–5.11)
RDW: 14.2 % (ref 11.5–15.5)
WBC: 18 10*3/uL — AB (ref 4.0–10.5)

## 2013-09-20 LAB — AMYLASE: AMYLASE: 28 U/L (ref 0–105)

## 2013-09-21 ENCOUNTER — Telehealth: Payer: Self-pay | Admitting: Internal Medicine

## 2013-09-21 LAB — URINE CULTURE: Colony Count: 35000

## 2013-09-21 NOTE — Telephone Encounter (Signed)
Spoke with pt.  11:30 am Friday.  States she feels better, no further severe pain "just soreness in my back".    Advised to keep appt with me Monday and if severe pain, fever,  N/V to go to ER   She voices understanding

## 2013-09-24 ENCOUNTER — Ambulatory Visit (INDEPENDENT_AMBULATORY_CARE_PROVIDER_SITE_OTHER): Payer: 59 | Admitting: Internal Medicine

## 2013-09-24 ENCOUNTER — Encounter: Payer: Self-pay | Admitting: Internal Medicine

## 2013-09-24 VITALS — BP 126/79 | HR 72 | Temp 97.9°F | Resp 16 | Wt 190.0 lb

## 2013-09-24 DIAGNOSIS — R319 Hematuria, unspecified: Secondary | ICD-10-CM

## 2013-09-24 DIAGNOSIS — R935 Abnormal findings on diagnostic imaging of other abdominal regions, including retroperitoneum: Secondary | ICD-10-CM

## 2013-09-24 LAB — POCT URINALYSIS DIPSTICK
BILIRUBIN UA: NEGATIVE
GLUCOSE UA: NEGATIVE
Ketones, UA: NEGATIVE
LEUKOCYTES UA: NEGATIVE
NITRITE UA: NEGATIVE
Protein, UA: NEGATIVE
Spec Grav, UA: 1.01
UROBILINOGEN UA: NEGATIVE
pH, UA: 6.5

## 2013-09-24 NOTE — Addendum Note (Signed)
Addended by: Conley Rolls on: 09/24/2013 12:16 PM   Modules accepted: Orders

## 2013-09-24 NOTE — Progress Notes (Signed)
Subjective:    Patient ID: Grace Vaughn, female    DOB: 12-21-1955, 58 y.o.   MRN: [id number]  HPI Grace Vaughn is here for follow up of flank pain and hematuria  See CT  No stones noted but suggestion of sub mm calcification in lower pole of kidney on the right.    Numerous Phleboliths  She had severe colic type pain on Friday but none since.   Vicodin helped  Going back to work today  No dysuria  No urgency  Allergies  Allergen Reactions  . Augmentin [Amoxicillin-Pot Clavulanate]   . Cephalexin Nausea And Vomiting  . Erythromycin Nausea And Vomiting  . Lisinopril Cough  . Losartan     Pain in extremities, paresthesias    Past Medical History  Diagnosis Date  . GERD (gastroesophageal reflux disease)   . Anxiety   . Vertigo   . Colon polyps   . Insomnia   . Hypertension    Past Surgical History  Procedure Laterality Date  . Cesarean section  1986, 1989  . Abdominal hysterectomy  01/1999   History   Social History  . Marital Status: Married    Spouse Name: N/A    Number of Children: N/A  . Years of Education: N/A   Occupational History  . Not on file.   Social History Main Topics  . Smoking status: Never Smoker   . Smokeless tobacco: Never Used  . Alcohol Use: 1.5 oz/week    3 drink(s) per week  . Drug Use: No  . Sexual Activity: Yes   Other Topics Concern  . Not on file   Social History Narrative  . No narrative on file   Family History  Problem Relation Age of Onset  . Breast cancer Mother   . Heart disease Mother     cardiomyopathy  . Heart disease Father     CHF   Patient Active Problem List   Diagnosis Date Noted  . Hypokalemia 11/29/2012  . Headache 08/29/2012  . History of skin cancer 09/08/2011  . Nausea alone 07/29/2011  . GERD (gastroesophageal reflux disease)   . Hypertension   . Anxiety   . Vertigo   . Colon polyps   . Insomnia    Current Outpatient Prescriptions on File Prior to Visit  Medication Sig Dispense Refill  .  buPROPion (WELLBUTRIN SR) 150 MG 12 hr tablet Take 150 mg by mouth daily.        . carisoprodol (SOMA) 350 MG tablet Take 350 mg by mouth 4 (four) times daily as needed for muscle spasms.      . clonazePAM (KLONOPIN) 0.5 MG tablet Take 1 tablet (0.5 mg total) by mouth at bedtime as needed.  30 tablet  1  . estradiol (ESTRACE) 0.5 MG tablet Take 0.5 mg by mouth daily.        . Hydrocodone-Acetaminophen (VICODIN ES) 7.5-300 MG TABS Take one tablet every 8 hours for pain  30 each  0  . olmesartan-hydrochlorothiazide (BENICAR HCT) 40-25 MG per tablet Take 1 tablet by mouth daily.  90 tablet  1  . omeprazole (PRILOSEC) 40 MG capsule Take 40 mg by mouth 2 (two) times daily.       . predniSONE (DELTASONE) 10 MG tablet Take 10 mg by mouth daily with breakfast. Taper dose       No current facility-administered medications on file prior to visit.       Review of Systems    see HPI Objective:  Physical Exam  Physical Exam  Nursing note and vitals reviewed.  Constitutional: She is oriented to person, place, and time. She appears well-developed and well-nourished.  HENT:  Head: Normocephalic and atraumatic.  Cardiovascular: Normal rate and regular rhythm. Exam reveals no gallop and no friction rub.  No murmur heard.  Pulmonary/Chest: Breath sounds normal. She has no wheezes. She has no rales. No CVA tenderness   Neurological: She is alert and oriented to person, place, and time.  Skin: Skin is warm and dry.  Psychiatric: She has a normal mood and affect. Her behavior is normal.            Assessment & Plan:  R CVA pain :  Pain certainly clinically sounds like colic   U/A today trace blood  .    If recurs ,  Will get CT with contrast  Hematuria  C and S multiple types  Non diagnostic   See me if pain returns

## 2013-10-15 ENCOUNTER — Other Ambulatory Visit: Payer: Self-pay | Admitting: *Deleted

## 2013-10-15 MED ORDER — OLMESARTAN MEDOXOMIL-HCTZ 40-25 MG PO TABS: 1.0000 | ORAL_TABLET | Freq: Every day | ORAL | Status: DC

## 2013-10-15 NOTE — Telephone Encounter (Signed)
Refill request

## 2013-10-17 ENCOUNTER — Telehealth: Payer: Self-pay | Admitting: *Deleted

## 2013-10-24 NOTE — Telephone Encounter (Signed)
Error

## 2014-02-06 ENCOUNTER — Ambulatory Visit (HOSPITAL_BASED_OUTPATIENT_CLINIC_OR_DEPARTMENT_OTHER)
Admission: RE | Admit: 2014-02-06 | Discharge: 2014-02-06 | Disposition: A | Discharge status: Home / Self Care | Payer: 59 | Source: Ambulatory Visit | Attending: Internal Medicine | Admitting: Internal Medicine

## 2014-02-06 ENCOUNTER — Other Ambulatory Visit (HOSPITAL_BASED_OUTPATIENT_CLINIC_OR_DEPARTMENT_OTHER): Payer: 59

## 2014-02-06 ENCOUNTER — Ambulatory Visit (INDEPENDENT_AMBULATORY_CARE_PROVIDER_SITE_OTHER): Payer: 59 | Admitting: Internal Medicine

## 2014-02-06 ENCOUNTER — Encounter: Payer: Self-pay | Admitting: Internal Medicine

## 2014-02-06 VITALS — BP 125/79 | HR 81 | Resp 16 | Ht 62.0 in | Wt 199.0 lb

## 2014-02-06 DIAGNOSIS — M25559 Pain in unspecified hip: Secondary | ICD-10-CM

## 2014-02-06 DIAGNOSIS — N949 Unspecified condition associated with female genital organs and menstrual cycle: Secondary | ICD-10-CM

## 2014-02-06 DIAGNOSIS — R3129 Other microscopic hematuria: Secondary | ICD-10-CM

## 2014-02-06 DIAGNOSIS — Z139 Encounter for screening, unspecified: Secondary | ICD-10-CM

## 2014-02-06 LAB — CBC WITH DIFFERENTIAL/PLATELET
Basophils Absolute: 0.1 10*3/uL (ref 0.0–0.1)
Basophils Relative: 1 % (ref 0–1)
EOS ABS: 0.1 10*3/uL (ref 0.0–0.7)
Eosinophils Relative: 1 % (ref 0–5)
HCT: 37.5 % (ref 36.0–46.0)
HEMOGLOBIN: 12.6 g/dL (ref 12.0–15.0)
LYMPHS PCT: 30 % (ref 12–46)
Lymphs Abs: 2.6 10*3/uL (ref 0.7–4.0)
MCH: 29.2 pg (ref 26.0–34.0)
MCHC: 33.6 g/dL (ref 30.0–36.0)
MCV: 87 fL (ref 78.0–100.0)
MONOS PCT: 8 % (ref 3–12)
Monocytes Absolute: 0.7 10*3/uL (ref 0.1–1.0)
NEUTROS ABS: 5.1 10*3/uL (ref 1.7–7.7)
Neutrophils Relative %: 60 % (ref 43–77)
PLATELETS: 362 10*3/uL (ref 150–400)
RBC: 4.31 MIL/uL (ref 3.87–5.11)
RDW: 13.3 % (ref 11.5–15.5)
WBC: 8.5 10*3/uL (ref 4.0–10.5)

## 2014-02-06 LAB — POCT URINALYSIS DIPSTICK
Bilirubin, UA: NEGATIVE
Glucose, UA: NEGATIVE
Ketones, UA: NEGATIVE
Leukocytes, UA: NEGATIVE
NITRITE UA: NEGATIVE
PH UA: 6
PROTEIN UA: NEGATIVE
Spec Grav, UA: 1.01
Urobilinogen, UA: 0.2

## 2014-02-06 LAB — AMYLASE: AMYLASE: 28 U/L (ref 0–105)

## 2014-02-06 LAB — COMPREHENSIVE METABOLIC PANEL
ALBUMIN: 4 g/dL (ref 3.5–5.2)
ALT: 13 U/L (ref 0–35)
AST: 20 U/L (ref 0–37)
Alkaline Phosphatase: 68 U/L (ref 39–117)
BILIRUBIN TOTAL: 0.3 mg/dL (ref 0.2–1.2)
BUN: 20 mg/dL (ref 6–23)
CHLORIDE: 100 meq/L (ref 96–112)
CO2: 27 meq/L (ref 19–32)
Calcium: 9.8 mg/dL (ref 8.4–10.5)
Creat: 0.77 mg/dL (ref 0.50–1.10)
GLUCOSE: 80 mg/dL (ref 70–99)
POTASSIUM: 4.7 meq/L (ref 3.5–5.3)
Sodium: 134 mEq/L — ABNORMAL LOW (ref 135–145)
TOTAL PROTEIN: 6.9 g/dL (ref 6.0–8.3)

## 2014-02-06 MED ORDER — NABUMETONE 500 MG PO TABS: ORAL_TABLET | ORAL | Status: DC

## 2014-02-06 NOTE — Progress Notes (Signed)
Subjective:    Patient ID: Grace Vaughn, female    DOB: 1956/06/02, 58 y.o.   MRN: [id number]  HPI Grace Vaughn is here for acute visit.    She is tearful today and under a lot of stress as her husband had a cardiac ablation (Dr. Ola Spurr [facility]) complicated by DVT and PE  She has noticed bilateral lower pelvic pain for the last 2 months. She does have a history ovarian cysts.  No N/V/D  no appetite change.  No dysuria.   She is S/P hysterectomy  Minimal hematuria  :  Ct has small calcifications  Dr. Risa Grill has seen CT   Also has bilateral hip pain for the past several months.    Worse with movement.   R hip worse than left  No injury or trauma.  She is wondering if she has bursitis  Allergies  Allergen Reactions  . Augmentin [Amoxicillin-Pot Clavulanate]   . Cephalexin Nausea And Vomiting  . Erythromycin Nausea And Vomiting  . Lisinopril Cough  . Losartan     Pain in extremities, paresthesias    Past Medical History  Diagnosis Date  . GERD (gastroesophageal reflux disease)   . Anxiety   . Vertigo   . Colon polyps   . Insomnia   . Hypertension    Past Surgical History  Procedure Laterality Date  . Cesarean section  1986, 1989  . Abdominal hysterectomy  01/1999   History   Social History  . Marital Status: Married    Spouse Name: N/A    Number of Children: N/A  . Years of Education: N/A   Occupational History  . Not on file.   Social History Main Topics  . Smoking status: Never Smoker   . Smokeless tobacco: Never Used  . Alcohol Use: 1.5 oz/week    3 drink(s) per week  . Drug Use: No  . Sexual Activity: Yes   Other Topics Concern  . Not on file   Social History Narrative  . No narrative on file   Family History  Problem Relation Age of Onset  . Breast cancer Mother   . Heart disease Mother     cardiomyopathy  . Heart disease Father     CHF   Patient Active Problem List   Diagnosis Date Noted  . Hematuria 09/24/2013  . Abnormal CT of the abdomen  09/24/2013  . Hypokalemia 11/29/2012  . Headache 08/29/2012  . History of skin cancer 09/08/2011  . Nausea alone 07/29/2011  . GERD (gastroesophageal reflux disease)   . Hypertension   . Anxiety   . Vertigo   . Colon polyps   . Insomnia    Current Outpatient Prescriptions on File Prior to Visit  Medication Sig Dispense Refill  . buPROPion (WELLBUTRIN SR) 150 MG 12 hr tablet Take 150 mg by mouth daily.        . carisoprodol (SOMA) 350 MG tablet Take 350 mg by mouth 4 (four) times daily as needed for muscle spasms.      . clonazePAM (KLONOPIN) 0.5 MG tablet Take 1 tablet (0.5 mg total) by mouth at bedtime as needed.  30 tablet  1  . estradiol (ESTRACE) 0.5 MG tablet Take 0.5 mg by mouth daily.        . Hydrocodone-Acetaminophen (VICODIN ES) 7.5-300 MG TABS Take one tablet every 8 hours for pain  30 each  0  . olmesartan-hydrochlorothiazide (BENICAR HCT) 40-25 MG per tablet Take 1 tablet by mouth daily.  90 tablet  1  . omeprazole (PRILOSEC) 40 MG capsule Take 40 mg by mouth 2 (two) times daily.       . predniSONE (DELTASONE) 10 MG tablet Take 10 mg by mouth daily with breakfast. Taper dose       No current facility-administered medications on file prior to visit.       Review of Systems See HPI    Objective:   Physical Exam  Physical Exam  Nursing note and vitals reviewed.  Constitutional: She is oriented to person, place, and time. She appears well-developed and well-nourished.  HENT:  Head: Normocephalic and atraumatic.  Cardiovascular: Normal rate and regular rhythm. Exam reveals no gallop and no friction rub.  No murmur heard.  Pulmonary/Chest: Breath sounds normal. She has no wheezes. She has no rales.  Abd  Pain in bilateral lower quadrant  No rebound or referred rebound Neurological: She is alert and oriented to person, place, and time.  Skin: Skin is warm and dry.  M/S  Pain with external rotation  R hip  NO pain with other ROM  .   Left hip pain with external  rotation Psychiatric: She has a normal mood and affect. Her behavior is normal.        Assessment & Plan:  Lower pelvic pain  U/a trace hematuria.  With history of ovarian cyst will get pelvic/TVUS      Hip pain may be bursitis but will get plain films today   RElafen 500 mg bid for short term only  Pt to call office in tomorrow for results

## 2014-02-08 ENCOUNTER — Encounter: Payer: Self-pay | Admitting: Internal Medicine

## 2014-02-08 ENCOUNTER — Telehealth: Payer: Self-pay | Admitting: Internal Medicine

## 2014-02-08 DIAGNOSIS — K5730 Diverticulosis of large intestine without perforation or abscess without bleeding: Secondary | ICD-10-CM | POA: Insufficient documentation

## 2014-02-08 DIAGNOSIS — M25559 Pain in unspecified hip: Secondary | ICD-10-CM

## 2014-02-08 NOTE — Telephone Encounter (Signed)
Spoke with pt and informed of hip xray,  And ultrasound results as well as labs and mm result.  Unable to visualize ovaries  She went out walking with daughter and doing "OK" with Nsaid but sore in both hips today  Will refer to Dr. Barbaraann Barthel for possible steroid injection

## 2014-02-14 ENCOUNTER — Encounter: Payer: Self-pay | Admitting: Family Medicine

## 2014-02-14 ENCOUNTER — Ambulatory Visit (INDEPENDENT_AMBULATORY_CARE_PROVIDER_SITE_OTHER): Payer: 59 | Admitting: Family Medicine

## 2014-02-14 VITALS — BP 129/87 | HR 85 | Ht 64.0 in | Wt 197.0 lb

## 2014-02-14 DIAGNOSIS — M25552 Pain in left hip: Principal | ICD-10-CM

## 2014-02-14 DIAGNOSIS — M25551 Pain in right hip: Secondary | ICD-10-CM

## 2014-02-14 DIAGNOSIS — M25559 Pain in unspecified hip: Secondary | ICD-10-CM

## 2014-02-14 MED ORDER — METHYLPREDNISOLONE ACETATE 40 MG/ML IJ SUSP
40.0000 mg | Freq: Once | INTRAMUSCULAR | Status: AC
  Administered 2014-02-14: 40 mg via INTRA_ARTICULAR

## 2014-02-14 NOTE — Patient Instructions (Signed)
You have IT band syndrome and trochanteric bursitis. Ice over area of pain 3-4 times a day for 15 minutes at a time as needed. Hip side raises and standing hip rotations 3 x 10 once a day - add weights if this becomes too easy. Stretches - pick 2 and hold for 20-30 seconds x 3 - do once or twice a day. Tylenol and/or aleve as needed for pain. Injections were given today. Follow up with me in 6 weeks. If not improving would consider physical therapy.

## 2014-02-14 NOTE — Progress Notes (Signed)
Patient ID: Grace Vaughn, female   DOB: 1956-05-28, 58 y.o.   MRN: [id number]  PCP: Kelton Pillar, MD  Subjective:   HPI: Patient is a 58 y.o. female here for bilateral hip pain.  Patient denies known injury. She states since may both of her hips have been hurting laterally and into groin. Has fallen 3 times this summer but pain has preceded this and not obviously worse after falling. Walks a lot at work. Given an NSAID by PCP which has helped a little. Has not had PT, MRI but radiographs without evidence DJD. She is a former Agricultural consultant.  Past Medical History  Diagnosis Date  . GERD (gastroesophageal reflux disease)   . Anxiety   . Vertigo   . Colon polyps   . Insomnia   . Hypertension     Current Outpatient Prescriptions on File Prior to Visit  Medication Sig Dispense Refill  . buPROPion (WELLBUTRIN SR) 150 MG 12 hr tablet Take 150 mg by mouth daily.        . clonazePAM (KLONOPIN) 0.5 MG tablet Take 1 tablet (0.5 mg total) by mouth at bedtime as needed.  30 tablet  1  . estradiol (ESTRACE) 0.5 MG tablet Take 0.5 mg by mouth daily.        . Hydrocodone-Acetaminophen (VICODIN ES) 7.5-300 MG TABS Take one tablet every 8 hours for pain  30 each  0  . nabumetone (RELAFEN) 500 MG tablet Take one bid with food  60 tablet  0  . olmesartan-hydrochlorothiazide (BENICAR HCT) 40-25 MG per tablet Take 1 tablet by mouth daily.  90 tablet  1  . omeprazole (PRILOSEC) 40 MG capsule Take 40 mg by mouth 2 (two) times daily.        No current facility-administered medications on file prior to visit.    Past Surgical History  Procedure Laterality Date  . Cesarean section  1986, 1989  . Abdominal hysterectomy  01/1999    Allergies  Allergen Reactions  . Augmentin [Amoxicillin-Pot Clavulanate]   . Cephalexin Nausea And Vomiting  . Erythromycin Nausea And Vomiting  . Lisinopril Cough  . Losartan     Pain in extremities, paresthesias     History   Social History  .  Marital Status: Married    Spouse Name: N/A    Number of Children: N/A  . Years of Education: N/A   Occupational History  . Not on file.   Social History Main Topics  . Smoking status: Never Smoker   . Smokeless tobacco: Never Used  . Alcohol Use: 1.5 oz/week    3 drink(s) per week  . Drug Use: No  . Sexual Activity: Yes   Other Topics Concern  . Not on file   Social History Narrative  . No narrative on file    Family History  Problem Relation Age of Onset  . Breast cancer Mother   . Heart disease Mother     cardiomyopathy  . Heart disease Father     CHF    BP 129/87  Pulse 85  Ht 5\' 4"  (1.626 m)  Wt 197 lb (89.359 kg)  BMI 33.80 kg/m2  Review of Systems: See HPI above.    Objective:  Physical Exam:  Gen: NAD  Bilateral hips/Back: No gross deformity, scoliosis. TTP bilateral trochanters.  No midline or bony TTP.  No other hip/back tenderness. FROM without pain on back actively, hip motion passively. Strength LEs 5/5 all muscle groups.  5-/5 with bilateral hip abduction,  some pain. 2+ MSRs in patellar and achilles tendons, equal bilaterally. Negative SLRs. Sensation intact to light touch bilaterally. Negative logroll bilateral hips Negative fabers and piriformis stretches.    Assessment & Plan:  1. Bilateral hip pain - 2/2 IT band syndrome and trochanteric bursitis.  Start home exercises - strengthening and stretching reviewed today.  Icing, tylenol/nsaids if needed.  Bilateral trochanter injections given today for bursitis.  F/u in 6 weeks.  Consider PT if not improving.  After informed written consent patient was lying on right side.  Area overlying left trochanteric bursa prepped with alcohol swab then injected with 6:2 marcaine: depomedrol.  Patient tolerated procedure well without immediate complications.  After informed written consent patient was lying on left side.  Area overlying right trochanteric bursa prepped with alcohol swab then injected  with 6:2 marcaine: depomedrol.  Patient tolerated procedure well without immediate complications.

## 2014-02-18 ENCOUNTER — Encounter: Payer: Self-pay | Admitting: Family Medicine

## 2014-02-18 DIAGNOSIS — M25551 Pain in right hip: Secondary | ICD-10-CM | POA: Insufficient documentation

## 2014-02-18 DIAGNOSIS — M25552 Pain in left hip: Secondary | ICD-10-CM

## 2014-02-18 NOTE — Assessment & Plan Note (Signed)
2/2 IT band syndrome and trochanteric bursitis.  Start home exercises - strengthening and stretching reviewed today.  Icing, tylenol/nsaids if needed.  Bilateral trochanter injections given today for bursitis.  F/u in 6 weeks.  Consider PT if not improving.  After informed written consent patient was lying on right side.  Area overlying left trochanteric bursa prepped with alcohol swab then injected with 6:2 marcaine: depomedrol.  Patient tolerated procedure well without immediate complications.  After informed written consent patient was lying on left side.  Area overlying right trochanteric bursa prepped with alcohol swab then injected with 6:2 marcaine: depomedrol.  Patient tolerated procedure well without immediate complications.

## 2014-03-12 ENCOUNTER — Other Ambulatory Visit: Payer: Self-pay | Admitting: *Deleted

## 2014-03-12 ENCOUNTER — Telehealth: Payer: Self-pay | Admitting: *Deleted

## 2014-03-12 MED ORDER — ESTRADIOL 0.5 MG PO TABS: 0.5000 mg | ORAL_TABLET | Freq: Every day | ORAL | Status: DC

## 2014-03-12 MED ORDER — BUPROPION HCL ER (SR) 150 MG PO TB12: 150.0000 mg | ORAL_TABLET | Freq: Every day | ORAL | Status: DC

## 2014-03-12 NOTE — Addendum Note (Signed)
Addended by: Emi Belfast D on: 03/12/2014 08:05 PM   Modules accepted: Orders

## 2014-03-12 NOTE — Telephone Encounter (Signed)
Pt called and spoke to [place] states:  Grace Vaughn is completely out of her Wellbutrin and Estradiol. She has been out for a few days, and now she is weepy and emotional.  estradiol (ESTRACE) 0.5 MG tablet Take 0.5 mg by mouth daily.  buPROPion (WELLBUTRIN SR) 150 MG 12 hr tablet Take 150 mg by mouth daily.

## 2014-03-12 NOTE — Telephone Encounter (Signed)
Grace Vaughn is completely out of her Wellbutrin and Estradiol.  She has been out for a few days, and now she is weepy and emotional.  estradiol (ESTRACE) 0.5 MG tablet  Take 0.5 mg by mouth daily.   buPROPion (WELLBUTRIN SR) 150 MG 12 hr tablet   Take 150 mg by mouth daily.

## 2014-03-28 ENCOUNTER — Ambulatory Visit (INDEPENDENT_AMBULATORY_CARE_PROVIDER_SITE_OTHER): Payer: 59 | Admitting: Family Medicine

## 2014-03-28 ENCOUNTER — Encounter: Payer: Self-pay | Admitting: Family Medicine

## 2014-03-28 VITALS — BP 127/85 | HR 88 | Ht 65.0 in | Wt 192.0 lb

## 2014-03-28 DIAGNOSIS — M25559 Pain in unspecified hip: Secondary | ICD-10-CM

## 2014-03-28 DIAGNOSIS — M25551 Pain in right hip: Secondary | ICD-10-CM

## 2014-03-28 DIAGNOSIS — M25552 Pain in left hip: Principal | ICD-10-CM

## 2014-04-01 ENCOUNTER — Encounter: Payer: Self-pay | Admitting: Family Medicine

## 2014-04-01 NOTE — Assessment & Plan Note (Signed)
2/2 IT band syndrome and trochanteric bursitis.  Improved following injections, did some of HEP.  F/u prn.

## 2014-04-01 NOTE — Progress Notes (Signed)
Patient ID: Grace Vaughn, female   DOB: 06-17-1956, 58 y.o.   MRN: [id number]  PCP: Kelton Pillar, MD  Subjective:   HPI: Patient is a 58 y.o. female here for bilateral hip pain.  7/30: Patient denies known injury. She states since may both of her hips have been hurting laterally and into groin. Has fallen 3 times this summer but pain has preceded this and not obviously worse after falling. Walks a lot at work. Given an NSAID by PCP which has helped a little. Has not had PT, MRI but radiographs without evidence DJD. She is a former Agricultural consultant.  9/10: Patient reports she has nearly completely improved. Done well following injections. Doing some exercises/stretches. Tripped and fell a couple times but did not hurt her hips. No other complaints.  Past Medical History  Diagnosis Date  . GERD (gastroesophageal reflux disease)   . Anxiety   . Vertigo   . Colon polyps   . Insomnia   . Hypertension     Current Outpatient Prescriptions on File Prior to Visit  Medication Sig Dispense Refill  . buPROPion (WELLBUTRIN SR) 150 MG 12 hr tablet Take 1 tablet (150 mg total) by mouth daily.  90 tablet  1  . clonazePAM (KLONOPIN) 0.5 MG tablet Take 1 tablet (0.5 mg total) by mouth at bedtime as needed.  30 tablet  1  . estradiol (ESTRACE) 0.5 MG tablet Take 1 tablet (0.5 mg total) by mouth daily.  90 tablet  1  . Hydrocodone-Acetaminophen (VICODIN ES) 7.5-300 MG TABS Take one tablet every 8 hours for pain  30 each  0  . nabumetone (RELAFEN) 500 MG tablet Take one bid with food  60 tablet  0  . olmesartan-hydrochlorothiazide (BENICAR HCT) 40-25 MG per tablet Take 1 tablet by mouth daily.  90 tablet  1  . omeprazole (PRILOSEC) 40 MG capsule Take 40 mg by mouth 2 (two) times daily.        No current facility-administered medications on file prior to visit.    Past Surgical History  Procedure Laterality Date  . Cesarean section  1986, 1989  . Abdominal hysterectomy  01/1999     Allergies  Allergen Reactions  . Augmentin [Amoxicillin-Pot Clavulanate]   . Cephalexin Nausea And Vomiting  . Erythromycin Nausea And Vomiting  . Lisinopril Cough  . Losartan     Pain in extremities, paresthesias     History   Social History  . Marital Status: Married    Spouse Name: N/A    Number of Children: N/A  . Years of Education: N/A   Occupational History  . Not on file.   Social History Main Topics  . Smoking status: Never Smoker   . Smokeless tobacco: Never Used  . Alcohol Use: 1.5 oz/week    3 drink(s) per week  . Drug Use: No  . Sexual Activity: Yes   Other Topics Concern  . Not on file   Social History Narrative  . No narrative on file    Family History  Problem Relation Age of Onset  . Breast cancer Mother   . Heart disease Mother     cardiomyopathy  . Heart disease Father     CHF    BP 127/85  Pulse 88  Ht 5\' 5"  (1.651 m)  Wt 192 lb (87.091 kg)  BMI 31.95 kg/m2  Review of Systems: See HPI above.    Objective:  Physical Exam:  Gen: NAD  Bilateral hips/Back: No gross deformity,  scoliosis. Minimal TTP bilateral trochanters.  No midline or bony TTP.  No other hip/back tenderness. FROM without pain on back actively, hip motion passively. Strength LEs 5/5 all muscle groups.  5/5 with bilateral hip abduction, no pain. 2+ MSRs in patellar and achilles tendons, equal bilaterally. Negative SLRs. Sensation intact to light touch bilaterally. Negative logroll bilateral hips Negative fabers and piriformis stretches.    Assessment & Plan:  1. Bilateral hip pain - 2/2 IT band syndrome and trochanteric bursitis.  Improved following injections, did some of HEP.  F/u prn.

## 2014-04-15 ENCOUNTER — Other Ambulatory Visit: Payer: Self-pay | Admitting: *Deleted

## 2014-04-15 MED ORDER — OLMESARTAN MEDOXOMIL-HCTZ 40-25 MG PO TABS: 1.0000 | ORAL_TABLET | Freq: Every day | ORAL | Status: DC

## 2014-04-30 ENCOUNTER — Other Ambulatory Visit: Payer: Self-pay | Admitting: *Deleted

## 2014-04-30 DIAGNOSIS — I10 Essential (primary) hypertension: Secondary | ICD-10-CM

## 2014-04-30 DIAGNOSIS — Z0000 Encounter for general adult medical examination without abnormal findings: Secondary | ICD-10-CM

## 2014-04-30 NOTE — Progress Notes (Signed)
Subjective:    Patient ID: Grace Vaughn, female    DOB: 02-21-1956, 58 y.o.   MRN: [id number]  HPI 9/10 sports medicine not 1. Bilateral hip pain - 2/2 IT band syndrome and trochanteric bursitis. Improved following injections, did some of HEP. F/u prn  3/9  My note R CVA pain : Pain certainly clinically sounds like colic U/A today trace blood . If recurs , Will get CT with contrast  Hematuria C and S multiple types Non diagnostic  See me if pain returns         Chong Sicilian is here for CPE  HM:  UTD with mm ,  And colonscopy ,   Will get flu vaccine at work,  Vaginal pap last year Dr. Kris Mouton.  She is S/P hysterectomy   Smoke < 1ppd for < 30 years   Hip pain  Hip is much improved now [place]  Pain with lifting shoulder.    HTN  Tolerating meds fine   GERD  She is on 80 mg bid prior to her cholecystectomy  Recommended by her gi  Post cholecystectomy diarrhea  Depending on diet she will have diarrhea in am since GB removed.  No abd pain   Skin Ca  She sees Dr. Tonia Brooms.   Hematuria  Trace blood today      Allergies  Allergen Reactions  . Augmentin [Amoxicillin-Pot Clavulanate]   . Cephalexin Nausea And Vomiting  . Erythromycin Nausea And Vomiting  . Lisinopril Cough  . Losartan     Pain in extremities, paresthesias    Past Medical History  Diagnosis Date  . GERD (gastroesophageal reflux disease)   . Anxiety   . Vertigo   . Colon polyps   . Insomnia   . Hypertension    Past Surgical History  Procedure Laterality Date  . Cesarean section  1986, 1989  . Abdominal hysterectomy  01/1999   History   Social History  . Marital Status: Married    Spouse Name: N/A    Number of Children: N/A  . Years of Education: N/A   Occupational History  . Not on file.   Social History Main Topics  . Smoking status: Never Smoker   . Smokeless tobacco: Never Used  . Alcohol Use: 1.5 oz/week   3 drink(s) per week  . Drug Use: No  . Sexual Activity: Yes   Other Topics Concern  . Not on file   Social History Narrative  . No narrative on file   Family History  Problem Relation Age of Onset  . Breast cancer Mother   . Heart disease Mother     cardiomyopathy  . Heart disease Father     CHF   Patient Active Problem List   Diagnosis Date Noted  . Bilateral hip pain 02/18/2014  . Diverticulosis of colon without hemorrhage 02/08/2014  . Hematuria 09/24/2013  . Abnormal CT of the abdomen 09/24/2013  . Hypokalemia 11/29/2012  . Headache 08/29/2012  . History of skin cancer 09/08/2011  . Nausea alone 07/29/2011  . GERD (gastroesophageal reflux disease)   . Hypertension   . Anxiety   . Vertigo   . Colon polyps   . Insomnia    Current Outpatient Prescriptions on File Prior to Visit  Medication Sig Dispense Refill  . buPROPion (WELLBUTRIN SR) 150 MG 12 hr tablet Take 1 tablet (150 mg total) by mouth daily.  90 tablet  1  . clonazePAM (KLONOPIN) 0.5 MG tablet Take 1 tablet (0.5 mg total) by mouth at bedtime as needed.  30 tablet  1  . estradiol (ESTRACE) 0.5 MG tablet Take 1 tablet (0.5 mg total) by mouth daily.  90 tablet  1  . Hydrocodone-Acetaminophen (VICODIN ES) 7.5-300 MG TABS Take one tablet every 8 hours for pain  30 each  0  . nabumetone (RELAFEN) 500 MG tablet Take one bid with food  60 tablet  0  . olmesartan-hydrochlorothiazide (BENICAR HCT) 40-25 MG per tablet Take 1 tablet by mouth daily.  90 tablet  0  . omeprazole (PRILOSEC) 40 MG capsule Take 40 mg by mouth 2 (two) times daily.        No current facility-administered medications on file prior to visit.         Assessment & Plan:   HM:    Review of Systems  Respiratory: Negative for cough, chest tightness, shortness of breath and wheezing.   Cardiovascular: Negative for chest pain, palpitations and leg swelling.  Gastrointestinal: Negative for abdominal pain.       Objective:   Physical  Exam Physical Exam  Physical Exam  Vital signs and nursing note reviewed  Constitutional: She is oriented to person, place, and time. She appears well-developed and well-nourished. She is cooperative.  HENT:  Head: Normocephalic and atraumatic.  Right Ear: Tympanic membrane normal.  Left Ear: Tympanic membrane normal.  Nose: Nose normal.  Mouth/Throat: Oropharynx is clear and moist and mucous membranes are normal. No oropharyngeal exudate or posterior oropharyngeal erythema.  Eyes: Conjunctivae and EOM are normal. Pupils are equal, round, and reactive to light.  Neck: Neck supple. No JVD present. Carotid bruit is not present. No mass and no thyromegaly present.  Cardiovascular: Regular rhythm, normal heart sounds, intact distal pulses and normal pulses.  Exam reveals no gallop and no friction rub.   No murmur heard. Pulses:      Dorsalis pedis pulses are 2+ on the right side, and 2+ on the left side.  Pulmonary/Chest: Breath sounds normal. She has no wheezes. She has no rhonchi. She has no rales. Right breast exhibits no mass, no nipple discharge and no skin change. Left breast exhibits no mass, no nipple discharge and no skin change.  Abdominal: Soft. Bowel sounds are normal. She exhibits no distension and no mass. There is no hepatosplenomegaly. There is no tenderness. There is no CVA tenderness.  Genitourinary: Rectum normal, vagina normal and uterus normal. Rectal exam shows no mass. Guaiac negative stool. No labial fusion. There is no lesion on the right labia. There is no lesion on the left labia. Vaginal pap obtained. Right adnexum displays no mass, no tenderness and no fullness. Left adnexum displays no mass, no tenderness and no fullness. No erythema around the vagina.  Musculoskeletal:       No active synovitis to any joint.    Lymphadenopathy:       Right cervical: No superficial cervical adenopathy present.      Left cervical: No superficial cervical adenopathy present.        Right axillary: No pectoral and no lateral adenopathy present.       Left axillary: No pectoral and no lateral adenopathy present.      Right: No inguinal adenopathy present.       Left: No inguinal adenopathy present.  Neurological: She is alert and oriented to person, place, and time. She has normal strength and normal reflexes. No  cranial nerve deficit or sensory deficit. She displays a negative Romberg sign. Coordination and gait normal.  Skin: Skin is warm and dry. No abrasion, no bruising, no ecchymosis and no rash noted. No cyanosis. Nails show no clubbing.  Psychiatric: She has a normal mood and affect. Her speech is normal and behavior is normal.          Assessment & Plan:  . HM:   Vaginal pap today  Will get flu vaccine at work.  Pt will check with [facility] regarding colonoscopy   Advised Zostabvax  Needs to check with insurance  Not a candidate for lung cancer screening  HTN  Continue meds  GERD  Will try to lower dose to 40 mg bid  If symptomatic she will need to see GI  Post cholecystectomy diarrhea  She does not wish meds at ths point  Skin ca  [facility] by dermatology   Hematuria  Trace today  CT done in [facility]   05/07/2011 report normal kidneys.   Will ask pt to give repeat specimen    Diverticulosis  Continue high fiber diet   Addendum  10/21  Repeat U/A shows no blood on chemstrip

## 2014-05-01 ENCOUNTER — Ambulatory Visit (INDEPENDENT_AMBULATORY_CARE_PROVIDER_SITE_OTHER): Payer: 59 | Admitting: Internal Medicine

## 2014-05-01 ENCOUNTER — Encounter: Payer: Self-pay | Admitting: Internal Medicine

## 2014-05-01 VITALS — BP 130/78 | HR 87 | Temp 97.8°F | Resp 16 | Ht 63.5 in | Wt 194.0 lb

## 2014-05-01 DIAGNOSIS — Z0189 Encounter for other specified special examinations: Secondary | ICD-10-CM

## 2014-05-01 DIAGNOSIS — Z0000 Encounter for general adult medical examination without abnormal findings: Secondary | ICD-10-CM

## 2014-05-01 DIAGNOSIS — Z124 Encounter for screening for malignant neoplasm of cervix: Secondary | ICD-10-CM

## 2014-05-01 DIAGNOSIS — I10 Essential (primary) hypertension: Secondary | ICD-10-CM

## 2014-05-01 DIAGNOSIS — K2100 Gastro-esophageal reflux disease with esophagitis, without bleeding: Secondary | ICD-10-CM

## 2014-05-01 DIAGNOSIS — R319 Hematuria, unspecified: Secondary | ICD-10-CM

## 2014-05-01 LAB — POCT URINALYSIS DIPSTICK
BILIRUBIN UA: NEGATIVE
GLUCOSE UA: NEGATIVE
Ketones, UA: NEGATIVE
Leukocytes, UA: NEGATIVE
Nitrite, UA: NEGATIVE
Protein, UA: NEGATIVE
SPEC GRAV UA: 1.015
Urobilinogen, UA: NEGATIVE
pH, UA: 6.5

## 2014-05-02 LAB — CYTOLOGY - PAP

## 2014-05-06 ENCOUNTER — Encounter: Payer: Self-pay | Admitting: *Deleted

## 2014-05-06 ENCOUNTER — Encounter: Payer: Self-pay | Admitting: Internal Medicine

## 2014-05-08 ENCOUNTER — Encounter: Payer: Self-pay | Admitting: Family Medicine

## 2014-05-08 ENCOUNTER — Other Ambulatory Visit (INDEPENDENT_AMBULATORY_CARE_PROVIDER_SITE_OTHER): Payer: 59

## 2014-05-08 ENCOUNTER — Ambulatory Visit (INDEPENDENT_AMBULATORY_CARE_PROVIDER_SITE_OTHER): Payer: 59 | Admitting: Family Medicine

## 2014-05-08 VITALS — BP 120/83 | HR 96 | Ht 64.0 in | Wt 195.0 lb

## 2014-05-08 DIAGNOSIS — R319 Hematuria, unspecified: Secondary | ICD-10-CM

## 2014-05-08 DIAGNOSIS — M25511 Pain in right shoulder: Secondary | ICD-10-CM

## 2014-05-08 LAB — POCT URINALYSIS DIPSTICK
BILIRUBIN UA: NEGATIVE
Blood, UA: NEGATIVE
Glucose, UA: NEGATIVE
Ketones, UA: NEGATIVE
LEUKOCYTES UA: NEGATIVE
Nitrite, UA: NEGATIVE
Protein, UA: NEGATIVE
SPEC GRAV UA: 1.015
Urobilinogen, UA: NEGATIVE
pH, UA: 6.5

## 2014-05-09 LAB — URINALYSIS, ROUTINE W REFLEX MICROSCOPIC
Bilirubin Urine: NEGATIVE
Glucose, UA: NEGATIVE mg/dL
Hgb urine dipstick: NEGATIVE
Ketones, ur: NEGATIVE mg/dL
LEUKOCYTES UA: NEGATIVE
NITRITE: NEGATIVE
Protein, ur: NEGATIVE mg/dL
SPECIFIC GRAVITY, URINE: 1.023 (ref 1.005–1.030)
Urobilinogen, UA: 0.2 mg/dL (ref 0.0–1.0)
pH: 5.5 (ref 5.0–8.0)

## 2014-05-10 ENCOUNTER — Encounter: Payer: Self-pay | Admitting: Family Medicine

## 2014-05-10 ENCOUNTER — Telehealth: Payer: Self-pay | Admitting: *Deleted

## 2014-05-10 DIAGNOSIS — M25511 Pain in right shoulder: Secondary | ICD-10-CM | POA: Insufficient documentation

## 2014-05-10 NOTE — Progress Notes (Signed)
Grace Vaughn is aware of her U/A results-eh

## 2014-05-10 NOTE — Progress Notes (Addendum)
Patient ID: Grace Vaughn, female   DOB: 11-06-1955, 58 y.o.   MRN: [id number]  PCP: Kelton Pillar, MD  Subjective:   HPI: Patient is a 58 y.o. female here for right shoulder pain.  Issue was briefly discussed at end of visit 9/10. She had a few falls as noted prior visit - had shoulder pain but hips were her primary issue in those visits. She had x-rays on 03/02/14 that were negative. She reports some improvement with basic home exercises she was given but pain has worsened. Soreness lateral right shoulder with radiation down arm. Waking her up in the middle of the night. Feels shoulder popping and throbbing by end of day. Taking pain medications at night, sleeping in recliner at night. Right handed. Worse with overhead motions.  Past Medical History  Diagnosis Date  . GERD (gastroesophageal reflux disease)   . Anxiety   . Vertigo   . Colon polyps   . Insomnia   . Hypertension     Current Outpatient Prescriptions on File Prior to Visit  Medication Sig Dispense Refill  . buPROPion (WELLBUTRIN SR) 150 MG 12 hr tablet Take 1 tablet (150 mg total) by mouth daily.  90 tablet  1  . clonazePAM (KLONOPIN) 0.5 MG tablet Take 1 tablet (0.5 mg total) by mouth at bedtime as needed.  30 tablet  1  . estradiol (ESTRACE) 0.5 MG tablet Take 1 tablet (0.5 mg total) by mouth daily.  90 tablet  1  . Hydrocodone-Acetaminophen (VICODIN ES) 7.5-300 MG TABS Take one tablet every 8 hours for pain  30 each  0  . olmesartan-hydrochlorothiazide (BENICAR HCT) 40-25 MG per tablet Take 1 tablet by mouth daily.  90 tablet  0  . omeprazole (PRILOSEC) 40 MG capsule Take 40 mg by mouth 2 (two) times daily.        No current facility-administered medications on file prior to visit.    Past Surgical History  Procedure Laterality Date  . Cesarean section  1986, 1989  . Abdominal hysterectomy  01/1999    Allergies  Allergen Reactions  . Augmentin [Amoxicillin-Pot Clavulanate]   . Cephalexin Nausea  And Vomiting  . Erythromycin Nausea And Vomiting  . Lisinopril Cough  . Losartan     Pain in extremities, paresthesias     History   Social History  . Marital Status: Married    Spouse Name: N/A    Number of Children: N/A  . Years of Education: N/A   Occupational History  . Not on file.   Social History Main Topics  . Smoking status: Never Smoker   . Smokeless tobacco: Never Used  . Alcohol Use: 1.5 oz/week    3 drink(s) per week  . Drug Use: No  . Sexual Activity: Yes   Other Topics Concern  . Not on file   Social History Narrative  . No narrative on file    Family History  Problem Relation Age of Onset  . Breast cancer Mother   . Heart disease Mother     cardiomyopathy  . Heart disease Father     CHF    BP 120/83  Pulse 96  Ht 5\' 4"  (1.626 m)  Wt 195 lb (88.451 kg)  BMI 33.46 kg/m2  Review of Systems: See HPI above.    Objective:  Physical Exam:  Gen: NAD  Right shoulder: No swelling, ecchymoses.  No gross deformity. No TTP. FROM with painful arc. Positive Hawkins, Neers. Negative Speeds, Yergasons. Strength 4/5 with empty can,  painful.  5/5 resisted internal/external rotation.  Mild pain ER. Negative apprehension. NV intact distally.    Assessment & Plan:  1. Right shoulder pain - concerning that she has gone 6 weeks after last visit with basic HEP following a trauma.  Radiographs were negative.  Advised we go ahead with MRI to assess for supraspinatus tear given weakness here.  Depending on those results will go ahead with physical therapy, injection, nitro patches, or ortho referral.    Addendum:  MRI reviewed and discussed with patient.  Surprisingly has a large anterior supraspinatus tear with 55mm retraction.  Advised will almost certainly need repair to regain function, strength in this shoulder and help with her pain.  Will refer to Dr. Len Childs - her husband has had shoulder surgery by him before.

## 2014-05-10 NOTE — Assessment & Plan Note (Signed)
concerning that she has gone 6 weeks after last visit with basic HEP following a trauma.  Radiographs were negative.  Advised we go ahead with MRI to assess for supraspinatus tear given weakness here.  Depending on those results will go ahead with physical therapy, injection, nitro patches, or ortho referral.

## 2014-05-10 NOTE — Telephone Encounter (Signed)
I spoke with Grace Vaughn and let her know that her U/A was normal this time -eh

## 2014-05-15 NOTE — Addendum Note (Signed)
Addended by: Sherrie George F on: 05/15/2014 12:12 PM   Modules accepted: Orders

## 2014-05-23 ENCOUNTER — Other Ambulatory Visit: Payer: Self-pay | Admitting: Dermatology

## 2014-05-29 ENCOUNTER — Encounter: Payer: Self-pay | Admitting: Internal Medicine

## 2014-05-29 DIAGNOSIS — C4492 Squamous cell carcinoma of skin, unspecified: Secondary | ICD-10-CM | POA: Insufficient documentation

## 2014-06-03 ENCOUNTER — Encounter: Payer: Self-pay | Admitting: *Deleted

## 2014-07-08 ENCOUNTER — Other Ambulatory Visit: Payer: Self-pay | Admitting: *Deleted

## 2014-07-08 NOTE — Telephone Encounter (Signed)
Refill request

## 2014-07-09 MED ORDER — OLMESARTAN MEDOXOMIL-HCTZ 40-25 MG PO TABS: 1.0000 | ORAL_TABLET | Freq: Every day | ORAL | Status: DC

## 2014-07-22 ENCOUNTER — Encounter: Payer: Self-pay | Admitting: *Deleted

## 2014-08-05 ENCOUNTER — Other Ambulatory Visit: Payer: Self-pay | Admitting: *Deleted

## 2014-08-05 DIAGNOSIS — G4700 Insomnia, unspecified: Secondary | ICD-10-CM

## 2014-08-05 NOTE — Telephone Encounter (Signed)
Felipe is requesting a refill for her Klonopin. She said that you have never written this before her, her former GYN did but she no longer sees them. She only sees you now

## 2014-08-06 MED ORDER — CLONAZEPAM 0.5 MG PO TABS: 0.5000 mg | ORAL_TABLET | Freq: Every evening | ORAL | Status: DC | PRN

## 2014-08-06 NOTE — Telephone Encounter (Signed)
R/X phoned in.

## 2014-09-04 ENCOUNTER — Encounter: Payer: Self-pay | Admitting: Internal Medicine

## 2014-09-04 ENCOUNTER — Ambulatory Visit (INDEPENDENT_AMBULATORY_CARE_PROVIDER_SITE_OTHER): Payer: 59 | Admitting: Internal Medicine

## 2014-09-04 ENCOUNTER — Telehealth: Payer: Self-pay | Admitting: *Deleted

## 2014-09-04 VITALS — BP 113/82 | HR 89 | Temp 98.1°F | Resp 16 | Ht 63.5 in | Wt 200.0 lb

## 2014-09-04 DIAGNOSIS — K219 Gastro-esophageal reflux disease without esophagitis: Secondary | ICD-10-CM

## 2014-09-04 DIAGNOSIS — J208 Acute bronchitis due to other specified organisms: Secondary | ICD-10-CM

## 2014-09-04 DIAGNOSIS — J9801 Acute bronchospasm: Secondary | ICD-10-CM

## 2014-09-04 DIAGNOSIS — R059 Cough, unspecified: Secondary | ICD-10-CM

## 2014-09-04 DIAGNOSIS — R05 Cough: Secondary | ICD-10-CM

## 2014-09-04 MED ORDER — AZITHROMYCIN 250 MG PO TABS: ORAL_TABLET | ORAL | Status: DC

## 2014-09-04 MED ORDER — HYDROCOD POLST-CHLORPHEN POLST 10-8 MG/5ML PO LQCR: 5.0000 mL | Freq: Two times a day (BID) | ORAL | Status: DC | PRN

## 2014-09-04 MED ORDER — METHYLPREDNISOLONE ACETATE 80 MG/ML IJ SUSP
160.0000 mg | Freq: Once | INTRAMUSCULAR | Status: AC
  Administered 2014-09-04: 160 mg via INTRAMUSCULAR

## 2014-09-04 MED ORDER — OMEPRAZOLE 40 MG PO CPDR: DELAYED_RELEASE_CAPSULE | ORAL | Status: DC

## 2014-09-04 MED ORDER — CLONAZEPAM 1 MG PO TABS: ORAL_TABLET | ORAL | Status: DC

## 2014-09-04 NOTE — Progress Notes (Signed)
Subjective:    Patient ID: Grace Vaughn, female    DOB: Mar 16, 1956, 59 y.o.   MRN: [id number]  HPI  Grace Vaughn is here for acute visit.   2 days of productive cough white sputum  No sore throat no chest pain no fever.    Slight wheezing at home   Has been out of Prilosec and having GERD symptoms    Allergies  Allergen Reactions  . Augmentin [Amoxicillin-Pot Clavulanate]   . Cephalexin Nausea And Vomiting  . Erythromycin Nausea And Vomiting  . Lisinopril Cough  . Losartan     Pain in extremities, paresthesias    Past Medical History  Diagnosis Date  . GERD (gastroesophageal reflux disease)   . Anxiety   . Vertigo   . Colon polyps   . Insomnia   . Hypertension    Past Surgical History  Procedure Laterality Date  . Cesarean section  1986, 1989  . Abdominal hysterectomy  01/1999   History   Social History  . Marital Status: Married    Spouse Name: N/A  . Number of Children: N/A  . Years of Education: N/A   Occupational History  . Not on file.   Social History Main Topics  . Smoking status: Never Smoker   . Smokeless tobacco: Never Used  . Alcohol Use: 1.5 oz/week    3 drink(s) per week  . Drug Use: No  . Sexual Activity: Yes   Other Topics Concern  . Not on file   Social History Narrative   Family History  Problem Relation Age of Onset  . Breast cancer Mother   . Heart disease Mother     cardiomyopathy  . Heart disease Father     CHF   Patient Active Problem List   Diagnosis Date Noted  . Squamous cell skin cancer 05/29/2014  . Right shoulder pain 05/10/2014  . Bilateral hip pain 02/18/2014  . Diverticulosis of colon without hemorrhage 02/08/2014  . Hematuria 09/24/2013  . Abnormal CT of the abdomen 09/24/2013  . Hypokalemia 11/29/2012  . Headache 08/29/2012  . Nausea alone 07/29/2011  . GERD (gastroesophageal reflux disease)   . Hypertension   . Anxiety   . Vertigo   .  colon polyp  last colonoscopy 2011 Dr. Gilford Rile   . Insomnia     Current Outpatient Prescriptions on File Prior to Visit  Medication Sig Dispense Refill  . buPROPion (WELLBUTRIN SR) 150 MG 12 hr tablet Take 1 tablet (150 mg total) by mouth daily. 90 tablet 1  . estradiol (ESTRACE) 0.5 MG tablet Take 1 tablet (0.5 mg total) by mouth daily. 90 tablet 1  . olmesartan-hydrochlorothiazide (BENICAR HCT) 40-25 MG per tablet Take 1 tablet by mouth daily. 90 tablet 1   No current facility-administered medications on file prior to visit.      Review of Systems See HPI    Objective:   Physical Exam Physical Exam  Nursing note and vitals reviewed.  Constitutional: She is oriented to person, place, and time. She appears well-developed and well-nourished.  HENT:  Head: Normocephalic and atraumatic.  Cardiovascular: Normal rate and regular rhythm. Exam reveals no gallop and no friction rub.  No murmur heard.  Pulmonary/Chest: Breath sounds normal. She has a few end exp wheezing. She has no rales.  Neurological: She is alert and oriented to person, place, and time.  Skin: Skin is warm and dry.  Psychiatric: She has a normal mood and affect. Her behavior is normal.  Assessment & Plan:  Bronchitis  Gave pt RX for Z-pak and told not to take unless any worsiening over 7-10 days  Bronchospasm:   Will give depomedrol 160 mg in office  Cough  tussionex q12h prn  GERD  Ok for prilosec bid for 2 weeks then once daily

## 2014-09-04 NOTE — Telephone Encounter (Signed)
Ben from Pharmacy called to adv pt insurance only allows 158ml of Tussionex which was filled and remaining 12ml was discarded.

## 2014-09-09 ENCOUNTER — Other Ambulatory Visit: Payer: Self-pay | Admitting: *Deleted

## 2014-09-09 MED ORDER — BUPROPION HCL ER (SR) 150 MG PO TB12: 150.0000 mg | ORAL_TABLET | Freq: Every day | ORAL | Status: DC

## 2014-09-09 MED ORDER — ESTRADIOL 0.5 MG PO TABS: 0.5000 mg | ORAL_TABLET | Freq: Every day | ORAL | Status: DC

## 2014-09-09 NOTE — Telephone Encounter (Signed)
Refill request

## 2014-10-20 ENCOUNTER — Telehealth: Payer: Self-pay | Admitting: Internal Medicine

## 2014-10-20 DIAGNOSIS — R93429 Abnormal radiologic findings on diagnostic imaging of unspecified kidney: Secondary | ICD-10-CM

## 2014-10-20 NOTE — Telephone Encounter (Signed)
Grace Vaughn  Call pt and advise her she is due for a one year repeat of a CT of abd and pelvis.   She had a calcium deposit in Right kidney that needs to be rechecked.  I put in order for CT of abd and pelvis with and without contrast.   Make sure pt is not allergic to contrast dye  .  She will likely need a BMP prior to test   Route back with date of xray

## 2014-11-01 ENCOUNTER — Other Ambulatory Visit: Payer: Self-pay | Admitting: *Deleted

## 2014-11-01 MED ORDER — OLMESARTAN MEDOXOMIL-HCTZ 40-25 MG PO TABS: 1.0000 | ORAL_TABLET | Freq: Every day | ORAL | Status: DC

## 2014-11-01 NOTE — Telephone Encounter (Signed)
Refill request

## 2014-11-05 NOTE — Telephone Encounter (Signed)
Patient is seeing her GYN for her Abdominal pain. She is declining the CT scan at this time

## 2014-11-08 ENCOUNTER — Other Ambulatory Visit (HOSPITAL_BASED_OUTPATIENT_CLINIC_OR_DEPARTMENT_OTHER): Payer: 59

## 2014-11-11 ENCOUNTER — Telehealth: Payer: Self-pay | Admitting: Internal Medicine

## 2014-11-11 NOTE — Telephone Encounter (Signed)
Spoke with pt and will set up CT with and w/o contrast  Abnormal CT of right kidney  and hematuria   Margaretha Sheffield  She will need BMP   And schedule for this week anytime after Tuesday   Order for Ct in chart

## 2014-11-11 NOTE — Telephone Encounter (Signed)
Grace Vaughn is set up for her CT on 11/12/13-eh

## 2014-11-13 ENCOUNTER — Ambulatory Visit (HOSPITAL_BASED_OUTPATIENT_CLINIC_OR_DEPARTMENT_OTHER)
Admission: RE | Admit: 2014-11-13 | Discharge: 2014-11-13 | Disposition: A | Discharge status: Home / Self Care | Payer: 59 | Source: Ambulatory Visit | Attending: Internal Medicine | Admitting: Internal Medicine

## 2014-11-13 ENCOUNTER — Encounter (HOSPITAL_BASED_OUTPATIENT_CLINIC_OR_DEPARTMENT_OTHER): Payer: Self-pay

## 2014-11-13 ENCOUNTER — Other Ambulatory Visit: Payer: Self-pay | Admitting: *Deleted

## 2014-11-13 DIAGNOSIS — R934 Abnormal findings on diagnostic imaging of urinary organs: Secondary | ICD-10-CM | POA: Diagnosis present

## 2014-11-13 DIAGNOSIS — R93429 Abnormal radiologic findings on diagnostic imaging of unspecified kidney: Secondary | ICD-10-CM

## 2014-11-13 MED ORDER — OMEPRAZOLE 40 MG PO CPDR: DELAYED_RELEASE_CAPSULE | ORAL | Status: DC

## 2014-11-13 MED ORDER — IOHEXOL 300 MG/ML  SOLN
100.0000 mL | Freq: Once | INTRAMUSCULAR | Status: AC | PRN
  Administered 2014-11-13: 100 mL via INTRAVENOUS

## 2014-11-13 MED ORDER — CLONAZEPAM 1 MG PO TABS: ORAL_TABLET | ORAL | Status: DC

## 2014-11-13 MED ORDER — OLMESARTAN MEDOXOMIL-HCTZ 40-25 MG PO TABS: 1.0000 | ORAL_TABLET | Freq: Every day | ORAL | Status: DC

## 2014-11-13 MED ORDER — ESTRADIOL 0.5 MG PO TABS: 0.5000 mg | ORAL_TABLET | Freq: Every day | ORAL | Status: DC

## 2014-11-13 MED ORDER — BUPROPION HCL ER (SR) 150 MG PO TB12: 150.0000 mg | ORAL_TABLET | Freq: Every day | ORAL | Status: DC

## 2014-11-13 NOTE — Telephone Encounter (Signed)
Pt came in office adv will pick up meds today but has no other refills available. She would like to have refills to last her until she can re-establish with new provider.

## 2014-11-14 ENCOUNTER — Telehealth: Payer: Self-pay | Admitting: *Deleted

## 2014-11-14 NOTE — Telephone Encounter (Signed)
Patient is aware of her CT results-eh

## 2014-11-14 NOTE — Telephone Encounter (Signed)
-----   Message from Lanice Shirts, MD sent at 11/13/2014  1:21 PM EDT ----- Call pt and let her know that her kidneys on Ct scan look normal  No mass or lesion seen and no stone seen  Ok to mail result to pt if she wishes

## 2014-11-20 ENCOUNTER — Other Ambulatory Visit (HOSPITAL_BASED_OUTPATIENT_CLINIC_OR_DEPARTMENT_OTHER): Payer: 59

## 2015-03-14 ENCOUNTER — Ambulatory Visit: Payer: 59 | Admitting: Family Medicine

## 2015-03-18 ENCOUNTER — Ambulatory Visit (INDEPENDENT_AMBULATORY_CARE_PROVIDER_SITE_OTHER): Payer: 59 | Admitting: Family Medicine

## 2015-03-18 ENCOUNTER — Encounter: Payer: Self-pay | Admitting: Family Medicine

## 2015-03-18 VITALS — BP 122/80 | HR 96 | Temp 98.2°F | Ht 64.0 in | Wt 197.8 lb

## 2015-03-18 DIAGNOSIS — K219 Gastro-esophageal reflux disease without esophagitis: Secondary | ICD-10-CM | POA: Diagnosis not present

## 2015-03-18 DIAGNOSIS — G2581 Restless legs syndrome: Secondary | ICD-10-CM | POA: Diagnosis not present

## 2015-03-18 DIAGNOSIS — F32A Depression, unspecified: Secondary | ICD-10-CM

## 2015-03-18 DIAGNOSIS — N951 Menopausal and female climacteric states: Secondary | ICD-10-CM | POA: Diagnosis not present

## 2015-03-18 DIAGNOSIS — R232 Flushing: Secondary | ICD-10-CM

## 2015-03-18 DIAGNOSIS — I10 Essential (primary) hypertension: Secondary | ICD-10-CM | POA: Diagnosis not present

## 2015-03-18 DIAGNOSIS — F329 Major depressive disorder, single episode, unspecified: Secondary | ICD-10-CM

## 2015-03-18 MED ORDER — CLONAZEPAM 1 MG PO TABS: ORAL_TABLET | ORAL | Status: DC

## 2015-03-18 MED ORDER — BUPROPION HCL ER (SR) 150 MG PO TB12: 150.0000 mg | ORAL_TABLET | Freq: Every day | ORAL | Status: DC

## 2015-03-18 MED ORDER — OMEPRAZOLE 40 MG PO CPDR: DELAYED_RELEASE_CAPSULE | ORAL | Status: DC

## 2015-03-18 MED ORDER — OLMESARTAN MEDOXOMIL-HCTZ 40-25 MG PO TABS: 1.0000 | ORAL_TABLET | Freq: Every day | ORAL | Status: DC

## 2015-03-18 MED ORDER — ESTRADIOL 0.5 MG PO TABS: 0.5000 mg | ORAL_TABLET | Freq: Every day | ORAL | Status: DC

## 2015-03-18 NOTE — Progress Notes (Signed)
Pre visit review using our clinic review tool, if applicable. No additional management support is needed unless otherwise documented below in the visit note. 

## 2015-03-18 NOTE — Patient Instructions (Signed)

## 2015-03-18 NOTE — Progress Notes (Signed)
Patient ID: Grace Vaughn, female    DOB: [birth year]  Age: 59 y.o. MRN: [id number]    Subjective:  Subjective HPI Grace Vaughn presents for bp , depression f/u and needs refills.    Review of Systems  Constitutional: Negative for diaphoresis, appetite change, fatigue and unexpected weight change.  Eyes: Negative for pain, redness and visual disturbance.  Respiratory: Negative for cough, chest tightness, shortness of breath and wheezing.   Cardiovascular: Negative for chest pain, palpitations and leg swelling.  Endocrine: Negative for cold intolerance, heat intolerance, polydipsia, polyphagia and polyuria.  Genitourinary: Negative for dysuria, frequency and difficulty urinating.  Neurological: Negative for dizziness, light-headedness, numbness and headaches.    History Past Medical History  Diagnosis Date  . GERD (gastroesophageal reflux disease)   . Anxiety   . Vertigo   . Colon polyps   . Insomnia   . Hypertension     She has past surgical history that includes Cesarean section ([place]) and Abdominal hysterectomy (01/1999).   Her family history includes Breast cancer in her mother; Heart disease in her father and mother.She reports that she has never smoked. She has never used smokeless tobacco. She reports that she drinks about 1.5 oz of alcohol per week. She reports that she does not use illicit drugs.  No current outpatient prescriptions on file prior to visit.   No current facility-administered medications on file prior to visit.     Objective:  Objective Physical Exam  Constitutional: She is oriented to person, place, and time. She appears well-developed and well-nourished.  HENT:  Head: Normocephalic and atraumatic.  Eyes: Conjunctivae and EOM are normal.  Neck: Normal range of motion. Neck supple. No JVD present. Carotid bruit is not present. No thyromegaly present.  Cardiovascular: Normal rate, regular rhythm and normal heart sounds.   No murmur  heard. Pulmonary/Chest: Effort normal and breath sounds normal. No respiratory distress. She has no wheezes. She has no rales. She exhibits no tenderness.  Musculoskeletal: She exhibits no edema.  Neurological: She is alert and oriented to person, place, and time.  Psychiatric: She has a normal mood and affect. Her behavior is normal.   BP 122/80 mmHg  Pulse 96  Temp(Src) 98.2 F (36.8 C) (Oral)  Ht 5\' 4"  (1.626 m)  Wt 197 lb 12.8 oz (89.721 kg)  BMI 33.94 kg/m2  SpO2 97% Wt Readings from Last 3 Encounters:  03/18/15 197 lb 12.8 oz (89.721 kg)  09/04/14 200 lb (90.719 kg)  05/08/14 195 lb (88.451 kg)     Lab Results  Component Value Date   WBC 8.5 02/06/2014   HGB 12.6 02/06/2014   HCT 37.5 02/06/2014   PLT 362 02/06/2014   GLUCOSE 80 02/06/2014   CHOL 189 09/27/2012   TRIG 138 09/27/2012   HDL 65 09/27/2012   LDLCALC 96 09/27/2012   ALT 13 02/06/2014   AST 20 02/06/2014   NA 134* 02/06/2014   K 4.7 02/06/2014   CL 100 02/06/2014   CREATININE 0.77 02/06/2014   BUN 20 02/06/2014   CO2 27 02/06/2014   TSH 2.759 09/27/2012    Ct Abdomen Pelvis W Contrast  11/13/2014   CLINICAL DATA:  Abnormal right kidney on prior CT.  EXAM: CT ABDOMEN AND PELVIS WITH CONTRAST  TECHNIQUE: Multidetector CT imaging of the abdomen and pelvis was performed using the standard protocol following bolus administration of intravenous contrast.  CONTRAST:  147mL OMNIPAQUE IOHEXOL 300 MG/ML  SOLN  COMPARISON:  CT 09/19/2013  FINDINGS:  Lower chest: Lung bases are clear. No effusions. Heart is normal size.  Hepatobiliary: No biliary ductal dilatation or focal hepatic lesion. Gallbladder grossly unremarkable.  Pancreas: No focal abnormality or ductal dilatation.  Spleen: No focal abnormality.  Normal size.  Adrenals/Urinary Tract: Adrenal glands normal. Both kidneys have a normal appearance. No renal or ureteral stones. No focal renal lesion. No hydronephrosis. Normal appearance of the collecting system  and proximal ureter on delayed images.  Stomach/Bowel: Stomach and small bowel unremarkable. Scattered sigmoid diverticulosis without active diverticulitis. Appendix is visualized and unremarkable.  Vascular/Lymphatic: No retroperitoneal or mesenteric adenopathy. Aorta normal caliber.  Reproductive: Prior hysterectomy.  No adnexal masses.  Other: No free fluid or free air.  Musculoskeletal: No focal bone lesion or acute bony abnormality.  IMPRESSION: No renal abnormality. No stones or focal lesion. No hydronephrosis.  No acute findings.   Electronically Signed   By: Rolm Baptise M.D.   On: 11/13/2014 12:15     Assessment & Plan:  Plan I have discontinued Ms. Navarra's azithromycin and chlorpheniramine-HYDROcodone. I am also having her maintain her buPROPion, clonazePAM, olmesartan-hydrochlorothiazide, omeprazole, and estradiol.  Meds ordered this encounter  Medications  . buPROPion (WELLBUTRIN SR) 150 MG 12 hr tablet    Sig: Take 1 tablet (150 mg total) by mouth daily.    Dispense:  90 tablet    Refill:  1  . clonazePAM (KLONOPIN) 1 MG tablet    Sig: Take one tablet at hs prn    Dispense:  30 tablet    Refill:  2  . olmesartan-hydrochlorothiazide (BENICAR HCT) 40-25 MG per tablet    Sig: Take 1 tablet by mouth daily.    Dispense:  90 tablet    Refill:  1    Dispense for Brand name Benicar  . omeprazole (PRILOSEC) 40 MG capsule    Sig: Take one tablet bid for 2 weeks then one daily    Dispense:  90 capsule    Refill:  1  . estradiol (ESTRACE) 0.5 MG tablet    Sig: Take 1 tablet (0.5 mg total) by mouth daily.    Dispense:  90 tablet    Refill:  1    Problem List Items Addressed This Visit    None    Visit Diagnoses    Essential hypertension    -  Primary    Relevant Medications    olmesartan-hydrochlorothiazide (BENICAR HCT) 40-25 MG per tablet    RLS (restless legs syndrome)        Relevant Medications    clonazePAM (KLONOPIN) 1 MG tablet    Gastroesophageal reflux disease,  esophagitis presence not specified        Relevant Medications    omeprazole (PRILOSEC) 40 MG capsule    Hot flashes        Relevant Medications    estradiol (ESTRACE) 0.5 MG tablet    Depression        Relevant Medications    buPROPion (WELLBUTRIN SR) 150 MG 12 hr tablet       Follow-up: Return in about 2 months (around 05/18/2015), or if symptoms worsen or fail to improve, for as scheduled.  Garnet Koyanagi, DO

## 2015-05-06 ENCOUNTER — Ambulatory Visit (INDEPENDENT_AMBULATORY_CARE_PROVIDER_SITE_OTHER): Payer: 59 | Admitting: Family Medicine

## 2015-05-06 ENCOUNTER — Other Ambulatory Visit: Payer: Self-pay | Admitting: Family Medicine

## 2015-05-06 ENCOUNTER — Encounter: Payer: Self-pay | Admitting: Family Medicine

## 2015-05-06 VITALS — BP 116/78 | HR 85 | Temp 97.8°F | Ht 64.0 in | Wt 195.4 lb

## 2015-05-06 DIAGNOSIS — I10 Essential (primary) hypertension: Secondary | ICD-10-CM | POA: Diagnosis not present

## 2015-05-06 DIAGNOSIS — Z23 Encounter for immunization: Secondary | ICD-10-CM

## 2015-05-06 DIAGNOSIS — Z0000 Encounter for general adult medical examination without abnormal findings: Secondary | ICD-10-CM | POA: Diagnosis not present

## 2015-05-06 DIAGNOSIS — E2839 Other primary ovarian failure: Secondary | ICD-10-CM | POA: Diagnosis not present

## 2015-05-06 DIAGNOSIS — Z1231 Encounter for screening mammogram for malignant neoplasm of breast: Secondary | ICD-10-CM

## 2015-05-06 LAB — POCT URINALYSIS DIPSTICK
Bilirubin, UA: NEGATIVE
Blood, UA: NEGATIVE
Glucose, UA: NEGATIVE
LEUKOCYTES UA: NEGATIVE
NITRITE UA: NEGATIVE
PH UA: 6
PROTEIN UA: NEGATIVE
Spec Grav, UA: 1.025
UROBILINOGEN UA: 0.2

## 2015-05-06 NOTE — Patient Instructions (Signed)
Preventive Care for Adults, Female A healthy lifestyle and preventive care can promote health and wellness. Preventive health guidelines for women include the following key practices.  A routine yearly physical is a good way to check with your health care provider about your health and preventive screening. It is a chance to share any concerns and updates on your health and to receive a thorough exam.  Visit your dentist for a routine exam and preventive care every 6 months. Brush your teeth twice a day and floss once a day. Good oral hygiene prevents tooth decay and gum disease.  The frequency of eye exams is based on your age, health, family medical history, use of contact lenses, and other factors. Follow your health care provider's recommendations for frequency of eye exams.  Eat a healthy diet. Foods like vegetables, fruits, whole grains, low-fat dairy products, and lean protein foods contain the nutrients you need without too many calories. Decrease your intake of foods high in solid fats, added sugars, and salt. Eat the right amount of calories for you.Get information about a proper diet from your health care provider, if necessary.  Regular physical exercise is one of the most important things you can do for your health. Most adults should get at least 150 minutes of moderate-intensity exercise (any activity that increases your heart rate and causes you to sweat) each week. In addition, most adults need muscle-strengthening exercises on 2 or more days a week.  Maintain a healthy weight. The body mass index (BMI) is a screening tool to identify possible weight problems. It provides an estimate of body fat based on height and weight. Your health care provider can find your BMI and can help you achieve or maintain a healthy weight.For adults 20 years and older:  A BMI below 18.5 is considered underweight.  A BMI of 18.5 to 24.9 is normal.  A BMI of 25 to 29.9 is considered overweight.  A  BMI of 30 and above is considered obese.  Maintain normal blood lipids and cholesterol levels by exercising and minimizing your intake of saturated fat. Eat a balanced diet with plenty of fruit and vegetables. Blood tests for lipids and cholesterol should begin at age 55 and be repeated every 5 years. If your lipid or cholesterol levels are high, you are over 50, or you are at high risk for heart disease, you may need your cholesterol levels checked more frequently.Ongoing high lipid and cholesterol levels should be treated with medicines if diet and exercise are not working.  If you smoke, find out from your health care provider how to quit. If you do not use tobacco, do not start.  Lung cancer screening is recommended for adults aged 69-80 years who are at high risk for developing lung cancer because of a history of smoking. A yearly low-dose CT scan of the lungs is recommended for people who have at least a 30-pack-year history of smoking and are a current smoker or have quit within the past 15 years. A pack year of smoking is smoking an average of 1 pack of cigarettes a day for 1 year (for example: 1 pack a day for 30 years or 2 packs a day for 15 years). Yearly screening should continue until the smoker has stopped smoking for at least 15 years. Yearly screening should be stopped for people who develop a health problem that would prevent them from having lung cancer treatment.  If you are pregnant, do not drink alcohol. If you are  breastfeeding, be very cautious about drinking alcohol. If you are not pregnant and choose to drink alcohol, do not have more than 1 drink per day. One drink is considered to be 12 ounces (355 mL) of beer, 5 ounces (148 mL) of wine, or 1.5 ounces (44 mL) of liquor.  Avoid use of street drugs. Do not share needles with anyone. Ask for help if you need support or instructions about stopping the use of drugs.  High blood pressure causes heart disease and increases the risk  of stroke. Your blood pressure should be checked at least every 1 to 2 years. Ongoing high blood pressure should be treated with medicines if weight loss and exercise do not work.  If you are 55-79 years old, ask your health care provider if you should take aspirin to prevent strokes.  Diabetes screening is done by taking a blood sample to check your blood glucose level after you have not eaten for a certain period of time (fasting). If you are not overweight and you do not have risk factors for diabetes, you should be screened once every 3 years starting at age 45. If you are overweight or obese and you are 40-70 years of age, you should be screened for diabetes every year as part of your cardiovascular risk assessment.  Breast cancer screening is essential preventive care for women. You should practice "breast self-awareness." This means understanding the normal appearance and feel of your breasts and may include breast self-examination. Any changes detected, no matter how small, should be reported to a health care provider. Women in their 20s and 30s should have a clinical breast exam (CBE) by a health care provider as part of a regular health exam every 1 to 3 years. After age 40, women should have a CBE every year. Starting at age 40, women should consider having a mammogram (breast X-ray test) every year. Women who have a family history of breast cancer should talk to their health care provider about genetic screening. Women at a high risk of breast cancer should talk to their health care providers about having an MRI and a mammogram every year.  Breast cancer gene (BRCA)-related cancer risk assessment is recommended for women who have family members with BRCA-related cancers. BRCA-related cancers include breast, ovarian, tubal, and peritoneal cancers. Having family members with these cancers may be associated with an increased risk for harmful changes (mutations) in the breast cancer genes BRCA1 and  BRCA2. Results of the assessment will determine the need for genetic counseling and BRCA1 and BRCA2 testing.  Your health care provider may recommend that you be screened regularly for cancer of the pelvic organs (ovaries, uterus, and vagina). This screening involves a pelvic examination, including checking for microscopic changes to the surface of your cervix (Pap test). You may be encouraged to have this screening done every 3 years, beginning at age 21.  For women ages 30-65, health care providers may recommend pelvic exams and Pap testing every 3 years, or they may recommend the Pap and pelvic exam, combined with testing for human papilloma virus (HPV), every 5 years. Some types of HPV increase your risk of cervical cancer. Testing for HPV may also be done on women of any age with unclear Pap test results.  Other health care providers may not recommend any screening for nonpregnant women who are considered low risk for pelvic cancer and who do not have symptoms. Ask your health care provider if a screening pelvic exam is right for   you.  If you have had past treatment for cervical cancer or a condition that could lead to cancer, you need Pap tests and screening for cancer for at least 20 years after your treatment. If Pap tests have been discontinued, your risk factors (such as having a new sexual partner) need to be reassessed to determine if screening should resume. Some women have medical problems that increase the chance of getting cervical cancer. In these cases, your health care provider may recommend more frequent screening and Pap tests.  Colorectal cancer can be detected and often prevented. Most routine colorectal cancer screening begins at the age of 50 years and continues through age 75 years. However, your health care provider may recommend screening at an earlier age if you have risk factors for colon cancer. On a yearly basis, your health care provider may provide home test kits to check  for hidden blood in the stool. Use of a small camera at the end of a tube, to directly examine the colon (sigmoidoscopy or colonoscopy), can detect the earliest forms of colorectal cancer. Talk to your health care provider about this at age 50, when routine screening begins. Direct exam of the colon should be repeated every 5-10 years through age 75 years, unless early forms of precancerous polyps or small growths are found.  People who are at an increased risk for hepatitis B should be screened for this virus. You are considered at high risk for hepatitis B if:  You were born in a country where hepatitis B occurs often. Talk with your health care provider about which countries are considered high risk.  Your parents were born in a high-risk country and you have not received a shot to protect against hepatitis B (hepatitis B vaccine).  You have HIV or AIDS.  You use needles to inject street drugs.  You live with, or have sex with, someone who has hepatitis B.  You get hemodialysis treatment.  You take certain medicines for conditions like cancer, organ transplantation, and autoimmune conditions.  Hepatitis C blood testing is recommended for all people born from 1945 through 1965 and any individual with known risks for hepatitis C.  Practice safe sex. Use condoms and avoid high-risk sexual practices to reduce the spread of sexually transmitted infections (STIs). STIs include gonorrhea, chlamydia, syphilis, trichomonas, herpes, HPV, and human immunodeficiency virus (HIV). Herpes, HIV, and HPV are viral illnesses that have no cure. They can result in disability, cancer, and death.  You should be screened for sexually transmitted illnesses (STIs) including gonorrhea and chlamydia if:  You are sexually active and are younger than 24 years.  You are older than 24 years and your health care provider tells you that you are at risk for this type of infection.  Your sexual activity has changed  since you were last screened and you are at an increased risk for chlamydia or gonorrhea. Ask your health care provider if you are at risk.  If you are at risk of being infected with HIV, it is recommended that you take a prescription medicine daily to prevent HIV infection. This is called preexposure prophylaxis (PrEP). You are considered at risk if:  You are sexually active and do not regularly use condoms or know the HIV status of your partner(s).  You take drugs by injection.  You are sexually active with a partner who has HIV.  Talk with your health care provider about whether you are at high risk of being infected with HIV. If   you choose to begin PrEP, you should first be tested for HIV. You should then be tested every 3 months for as long as you are taking PrEP.  Osteoporosis is a disease in which the bones lose minerals and strength with aging. This can result in serious bone fractures or breaks. The risk of osteoporosis can be identified using a bone density scan. Women ages 88 years and over and women at risk for fractures or osteoporosis should discuss screening with their health care providers. Ask your health care provider whether you should take a calcium supplement or vitamin D to reduce the rate of osteoporosis.  Menopause can be associated with physical symptoms and risks. Hormone replacement therapy is available to decrease symptoms and risks. You should talk to your health care provider about whether hormone replacement therapy is right for you.  Use sunscreen. Apply sunscreen liberally and repeatedly throughout the day. You should seek shade when your shadow is shorter than you. Protect yourself by wearing long sleeves, pants, a wide-brimmed hat, and sunglasses year round, whenever you are outdoors.  Once a month, do a whole body skin exam, using a mirror to look at the skin on your back. Tell your health care provider of new moles, moles that have irregular borders, moles that  are larger than a pencil eraser, or moles that have changed in shape or color.  Stay current with required vaccines (immunizations).  Influenza vaccine. All adults should be immunized every year.  Tetanus, diphtheria, and acellular pertussis (Td, Tdap) vaccine. Pregnant women should receive 1 dose of Tdap vaccine during each pregnancy. The dose should be obtained regardless of the length of time since the last dose. Immunization is preferred during the 27th-36th week of gestation. An adult who has not previously received Tdap or who does not know her vaccine status should receive 1 dose of Tdap. This initial dose should be followed by tetanus and diphtheria toxoids (Td) booster doses every 10 years. Adults with an unknown or incomplete history of completing a 3-dose immunization series with Td-containing vaccines should begin or complete a primary immunization series including a Tdap dose. Adults should receive a Td booster every 10 years.  Varicella vaccine. An adult without evidence of immunity to varicella should receive 2 doses or a second dose if she has previously received 1 dose. Pregnant females who do not have evidence of immunity should receive the first dose after pregnancy. This first dose should be obtained before leaving the health care facility. The second dose should be obtained 4-8 weeks after the first dose.  Human papillomavirus (HPV) vaccine. Females aged 13-26 years who have not received the vaccine previously should obtain the 3-dose series. The vaccine is not recommended for use in pregnant females. However, pregnancy testing is not needed before receiving a dose. If a female is found to be pregnant after receiving a dose, no treatment is needed. In that case, the remaining doses should be delayed until after the pregnancy. Immunization is recommended for any person with an immunocompromised condition through the age of 64 years if she did not get any or all doses earlier. During the  3-dose series, the second dose should be obtained 4-8 weeks after the first dose. The third dose should be obtained 24 weeks after the first dose and 16 weeks after the second dose.  Zoster vaccine. One dose is recommended for adults aged 49 years or older unless certain conditions are present.  Measles, mumps, and rubella (MMR) vaccine. Adults born  before 1957 generally are considered immune to measles and mumps. Adults born in 1957 or later should have 1 or more doses of MMR vaccine unless there is a contraindication to the vaccine or there is laboratory evidence of immunity to each of the three diseases. A routine second dose of MMR vaccine should be obtained at least 28 days after the first dose for students attending postsecondary schools, health care workers, or international travelers. People who received inactivated measles vaccine or an unknown type of measles vaccine during 1963-1967 should receive 2 doses of MMR vaccine. People who received inactivated mumps vaccine or an unknown type of mumps vaccine before 1979 and are at high risk for mumps infection should consider immunization with 2 doses of MMR vaccine. For females of childbearing age, rubella immunity should be determined. If there is no evidence of immunity, females who are not pregnant should be vaccinated. If there is no evidence of immunity, females who are pregnant should delay immunization until after pregnancy. Unvaccinated health care workers born before 1957 who lack laboratory evidence of measles, mumps, or rubella immunity or laboratory confirmation of disease should consider measles and mumps immunization with 2 doses of MMR vaccine or rubella immunization with 1 dose of MMR vaccine.  Pneumococcal 13-valent conjugate (PCV13) vaccine. When indicated, a person who is uncertain of his immunization history and has no record of immunization should receive the PCV13 vaccine. All adults 65 years of age and older should receive this  vaccine. An adult aged 19 years or older who has certain medical conditions and has not been previously immunized should receive 1 dose of PCV13 vaccine. This PCV13 should be followed with a dose of pneumococcal polysaccharide (PPSV23) vaccine. Adults who are at high risk for pneumococcal disease should obtain the PPSV23 vaccine at least 8 weeks after the dose of PCV13 vaccine. Adults older than 58 years of age who have normal immune system function should obtain the PPSV23 vaccine dose at least 1 year after the dose of PCV13 vaccine.  Pneumococcal polysaccharide (PPSV23) vaccine. When PCV13 is also indicated, PCV13 should be obtained first. All adults aged 65 years and older should be immunized. An adult younger than age 65 years who has certain medical conditions should be immunized. Any person who resides in a nursing home or long-term care facility should be immunized. An adult smoker should be immunized. People with an immunocompromised condition and certain other conditions should receive both PCV13 and PPSV23 vaccines. People with human immunodeficiency virus (HIV) infection should be immunized as soon as possible after diagnosis. Immunization during chemotherapy or radiation therapy should be avoided. Routine use of PPSV23 vaccine is not recommended for American Indians, [place] Natives, or people younger than 65 years unless there are medical conditions that require PPSV23 vaccine. When indicated, people who have unknown immunization and have no record of immunization should receive PPSV23 vaccine. One-time revaccination 5 years after the first dose of PPSV23 is recommended for people aged 19-64 years who have chronic kidney failure, nephrotic syndrome, asplenia, or immunocompromised conditions. People who received 1-2 doses of PPSV23 before age 65 years should receive another dose of PPSV23 vaccine at age 65 years or later if at least 5 years have passed since the previous dose. Doses of PPSV23 are not  needed for people immunized with PPSV23 at or after age 65 years.  Meningococcal vaccine. Adults with asplenia or persistent complement component deficiencies should receive 2 doses of quadrivalent meningococcal conjugate (MenACWY-D) vaccine. The doses should be obtained   at least 2 months apart. Microbiologists working with certain meningococcal bacteria, [employer] recruits, people at risk during an outbreak, and people who travel to or live in countries with a high rate of meningitis should be immunized. A first-year college student up through age 59 years who is living in a residence hall should receive a dose if she did not receive a dose on or after her 16th birthday. Adults who have certain high-risk conditions should receive one or more doses of vaccine.  Hepatitis A vaccine. Adults who wish to be protected from this disease, have certain high-risk conditions, work with hepatitis A-infected animals, work in hepatitis A research labs, or travel to or work in countries with a high rate of hepatitis A should be immunized. Adults who were previously unvaccinated and who anticipate close contact with an international adoptee during the first 60 days after arrival in the [place] from a country with a high rate of hepatitis A should be immunized.  Hepatitis B vaccine. Adults who wish to be protected from this disease, have certain high-risk conditions, may be exposed to blood or other infectious body fluids, are household contacts or sex partners of hepatitis B positive people, are clients or workers in certain care facilities, or travel to or work in countries with a high rate of hepatitis B should be immunized.  Haemophilus influenzae type b (Hib) vaccine. A previously unvaccinated person with asplenia or sickle cell disease or having a scheduled splenectomy should receive 1 dose of Hib vaccine. Regardless of previous immunization, a recipient of a hematopoietic stem cell transplant should receive a  3-dose series 6-12 months after her successful transplant. Hib vaccine is not recommended for adults with HIV infection. Preventive Services / Frequency Ages 64 to 71 years  Blood pressure check.** / Every 3-5 years.  Lipid and cholesterol check.** / Every 5 years beginning at age 15.  Clinical breast exam.** / Every 3 years for women in their 35s and 44s.  BRCA-related cancer risk assessment.** / For women who have family members with a BRCA-related cancer (breast, ovarian, tubal, or peritoneal cancers).  Pap test.** / Every 2 years from ages 29 through 58. Every 3 years starting at age 10 through age 25 or 2 with a history of 3 consecutive normal Pap tests.  HPV screening.** / Every 3 years from ages 54 through ages 2 to 29 with a history of 3 consecutive normal Pap tests.  Hepatitis C blood test.** / For any individual with known risks for hepatitis C.  Skin self-exam. / Monthly.  Influenza vaccine. / Every year.  Tetanus, diphtheria, and acellular pertussis (Tdap, Td) vaccine.** / Consult your health care provider. Pregnant women should receive 1 dose of Tdap vaccine during each pregnancy. 1 dose of Td every 10 years.  Varicella vaccine.** / Consult your health care provider. Pregnant females who do not have evidence of immunity should receive the first dose after pregnancy.  HPV vaccine. / 3 doses over 6 months, if 60 and younger. The vaccine is not recommended for use in pregnant females. However, pregnancy testing is not needed before receiving a dose.  Measles, mumps, rubella (MMR) vaccine.** / You need at least 1 dose of MMR if you were born in 1957 or later. You may also need a 2nd dose. For females of childbearing age, rubella immunity should be determined. If there is no evidence of immunity, females who are not pregnant should be vaccinated. If there is no evidence of immunity, females who are  pregnant should delay immunization until after pregnancy.  Pneumococcal  13-valent conjugate (PCV13) vaccine.** / Consult your health care provider.  Pneumococcal polysaccharide (PPSV23) vaccine.** / 1 to 2 doses if you smoke cigarettes or if you have certain conditions.  Meningococcal vaccine.** / 1 dose if you are age 68 to 8 years and a Market researcher living in a residence hall, or have one of several medical conditions, you need to get vaccinated against meningococcal disease. You may also need additional booster doses.  Hepatitis A vaccine.** / Consult your health care provider.  Hepatitis B vaccine.** / Consult your health care provider.  Haemophilus influenzae type b (Hib) vaccine.** / Consult your health care provider. Ages 7 to 53 years  Blood pressure check.** / Every year.  Lipid and cholesterol check.** / Every 5 years beginning at age 25 years.  Lung cancer screening. / Every year if you are aged 11-80 years and have a 30-pack-year history of smoking and currently smoke or have quit within the past 15 years. Yearly screening is stopped once you have quit smoking for at least 15 years or develop a health problem that would prevent you from having lung cancer treatment.  Clinical breast exam.** / Every year after age 48 years.  BRCA-related cancer risk assessment.** / For women who have family members with a BRCA-related cancer (breast, ovarian, tubal, or peritoneal cancers).  Mammogram.** / Every year beginning at age 41 years and continuing for as long as you are in good health. Consult with your health care provider.  Pap test.** / Every 3 years starting at age 65 years through age 37 or 70 years with a history of 3 consecutive normal Pap tests.  HPV screening.** / Every 3 years from ages 72 years through ages 60 to 40 years with a history of 3 consecutive normal Pap tests.  Fecal occult blood test (FOBT) of stool. / Every year beginning at age 21 years and continuing until age 5 years. You may not need to do this test if you get  a colonoscopy every 10 years.  Flexible sigmoidoscopy or colonoscopy.** / Every 5 years for a flexible sigmoidoscopy or every 10 years for a colonoscopy beginning at age 35 years and continuing until age 48 years.  Hepatitis C blood test.** / For all people born from [birth year] through 1965 and any individual with known risks for hepatitis C.  Skin self-exam. / Monthly.  Influenza vaccine. / Every year.  Tetanus, diphtheria, and acellular pertussis (Tdap/Td) vaccine.** / Consult your health care provider. Pregnant women should receive 1 dose of Tdap vaccine during each pregnancy. 1 dose of Td every 10 years.  Varicella vaccine.** / Consult your health care provider. Pregnant females who do not have evidence of immunity should receive the first dose after pregnancy.  Zoster vaccine.** / 1 dose for adults aged 30 years or older.  Measles, mumps, rubella (MMR) vaccine.** / You need at least 1 dose of MMR if you were born in 1957 or later. You may also need a second dose. For females of childbearing age, rubella immunity should be determined. If there is no evidence of immunity, females who are not pregnant should be vaccinated. If there is no evidence of immunity, females who are pregnant should delay immunization until after pregnancy.  Pneumococcal 13-valent conjugate (PCV13) vaccine.** / Consult your health care provider.  Pneumococcal polysaccharide (PPSV23) vaccine.** / 1 to 2 doses if you smoke cigarettes or if you have certain conditions.  Meningococcal vaccine.** /  Consult your health care provider.  Hepatitis A vaccine.** / Consult your health care provider.  Hepatitis B vaccine.** / Consult your health care provider.  Haemophilus influenzae type b (Hib) vaccine.** / Consult your health care provider. Ages 64 years and over  Blood pressure check.** / Every year.  Lipid and cholesterol check.** / Every 5 years beginning at age 23 years.  Lung cancer screening. / Every year if you  are aged 16-80 years and have a 30-pack-year history of smoking and currently smoke or have quit within the past 15 years. Yearly screening is stopped once you have quit smoking for at least 15 years or develop a health problem that would prevent you from having lung cancer treatment.  Clinical breast exam.** / Every year after age 74 years.  BRCA-related cancer risk assessment.** / For women who have family members with a BRCA-related cancer (breast, ovarian, tubal, or peritoneal cancers).  Mammogram.** / Every year beginning at age 44 years and continuing for as long as you are in good health. Consult with your health care provider.  Pap test.** / Every 3 years starting at age 58 years through age 22 or 39 years with 3 consecutive normal Pap tests. Testing can be stopped between 65 and 70 years with 3 consecutive normal Pap tests and no abnormal Pap or HPV tests in the past 10 years.  HPV screening.** / Every 3 years from ages 64 years through ages 70 or 61 years with a history of 3 consecutive normal Pap tests. Testing can be stopped between 65 and 70 years with 3 consecutive normal Pap tests and no abnormal Pap or HPV tests in the past 10 years.  Fecal occult blood test (FOBT) of stool. / Every year beginning at age 40 years and continuing until age 27 years. You may not need to do this test if you get a colonoscopy every 10 years.  Flexible sigmoidoscopy or colonoscopy.** / Every 5 years for a flexible sigmoidoscopy or every 10 years for a colonoscopy beginning at age 7 years and continuing until age 32 years.  Hepatitis C blood test.** / For all people born from [birth year] through 1964 and any individual with known risks for hepatitis C.  Osteoporosis screening.** / A one-time screening for women ages 30 years and over and women at risk for fractures or osteoporosis.  Skin self-exam. / Monthly.  Influenza vaccine. / Every year.  Tetanus, diphtheria, and acellular pertussis (Tdap/Td)  vaccine.** / 1 dose of Td every 10 years.  Varicella vaccine.** / Consult your health care provider.  Zoster vaccine.** / 1 dose for adults aged 35 years or older.  Pneumococcal 13-valent conjugate (PCV13) vaccine.** / Consult your health care provider.  Pneumococcal polysaccharide (PPSV23) vaccine.** / 1 dose for all adults aged 46 years and older.  Meningococcal vaccine.** / Consult your health care provider.  Hepatitis A vaccine.** / Consult your health care provider.  Hepatitis B vaccine.** / Consult your health care provider.  Haemophilus influenzae type b (Hib) vaccine.** / Consult your health care provider. ** Family history and personal history of risk and conditions may change your health care provider's recommendations.   This information is not intended to replace advice given to you by your health care provider. Make sure you discuss any questions you have with your health care provider.   Document Released: 08/31/2001 Document Revised: 07/26/2014 Document Reviewed: 11/30/2010 Elsevier Interactive Patient Education [employer].

## 2015-05-06 NOTE — Progress Notes (Signed)
Subjective:     Grace Vaughn is a 59 y.o. female and is here for a comprehensive physical exam. The patient reports no problems.  Social History   Social History  . Marital Status: Married    Spouse Name: N/A  . Number of Children: N/A  . Years of Education: N/A   Occupational History  .      [employer]   Social History Main Topics  . Smoking status: Never Smoker   . Smokeless tobacco: Never Used  . Alcohol Use: 1.5 oz/week    3 drink(s) per week  . Drug Use: No  . Sexual Activity: Yes   Other Topics Concern  . Not on file   Social History Narrative   Exercise--- walking 10,000 steps daily   Health Maintenance  Topic Date Due  . Hepatitis C Screening  [birth year]  . HIV Screening  09/22/1971  . TETANUS/TDAP  05/07/2015 (Originally 09/22/1975)  . INFLUENZA VACCINE  05/18/2015 (Originally 02/17/2015)  . MAMMOGRAM  02/07/2016  . PAP SMEAR  05/01/2017  . COLONOSCOPY  07/18/2020    The following portions of the patient's history were reviewed and updated as appropriate:  She  has a past medical history of GERD (gastroesophageal reflux disease); Anxiety; Vertigo; Colon polyps; Insomnia; and Hypertension. She  does not have any pertinent problems on file. She  has past surgical history that includes Cesarean section ([place]) and Abdominal hysterectomy (01/1999). Her family history includes Breast cancer in her mother; Heart disease in her father and mother. She  reports that she has never smoked. She has never used smokeless tobacco. She reports that she drinks about 1.5 oz of alcohol per week. She reports that she does not use illicit drugs. She has a current medication list which includes the following prescription(s): bupropion, clonazepam, estradiol, olmesartan-hydrochlorothiazide, and omeprazole. Current Outpatient Prescriptions on File Prior to Visit  Medication Sig Dispense Refill  . buPROPion (WELLBUTRIN SR) 150 MG 12 hr tablet Take 1 tablet (150  mg total) by mouth daily. 90 tablet 1  . clonazePAM (KLONOPIN) 1 MG tablet Take one tablet at hs prn 30 tablet 2  . estradiol (ESTRACE) 0.5 MG tablet Take 1 tablet (0.5 mg total) by mouth daily. 90 tablet 1  . olmesartan-hydrochlorothiazide (BENICAR HCT) 40-25 MG per tablet Take 1 tablet by mouth daily. 90 tablet 1  . omeprazole (PRILOSEC) 40 MG capsule Take one tablet bid for 2 weeks then one daily 90 capsule 1   No current facility-administered medications on file prior to visit.   She is allergic to augmentin; cephalexin; erythromycin; lisinopril; and losartan..  Review of Systems Review of Systems  Constitutional: Negative for activity change, appetite change and fatigue.  HENT: Negative for hearing loss, congestion, tinnitus and ear discharge.  dentist q23mEyes: Negative for visual disturbance (see optho q1y -- vision corrected to 20/20 with glasses).  Respiratory: Negative for cough, chest tightness and shortness of breath.   Cardiovascular: Negative for chest pain, palpitations and leg swelling.  Gastrointestinal: Negative for abdominal pain, diarrhea, constipation and abdominal distention.  Genitourinary: Negative for urgency, frequency, decreased urine volume and difficulty urinating.  Musculoskeletal: Negative for back pain, arthralgias and gait problem.  Skin: Negative for color change, pallor and rash.  Neurological: Negative for dizziness, light-headedness, numbness and headaches.  Hematological: Negative for adenopathy. Does not bruise/bleed easily.  Psychiatric/Behavioral: Negative for suicidal ideas, confusion, sleep disturbance, self-injury, dysphoric mood, decreased concentration and agitation.       Objective:  BP 116/78 mmHg  Pulse 85  Temp(Src) 97.8 F (36.6 C) (Oral)  Ht 5' 4"  (1.626 m)  Wt 195 lb 6.4 oz (88.633 kg)  BMI 33.52 kg/m2  SpO2 97% General appearance: alert, cooperative, appears stated age and no distress Head: Normocephalic, without obvious  abnormality, atraumatic Eyes: conjunctivae/corneas clear. PERRL, EOM's intact. Fundi benign. Ears: normal TM's and external ear canals both ears Nose: Nares normal. Septum midline. Mucosa normal. No drainage or sinus tenderness. Throat: lips, mucosa, and tongue normal; teeth and gums normal Neck: no adenopathy, no carotid bruit, no JVD, supple, symmetrical, trachea midline and thyroid not enlarged, symmetric, no tenderness/mass/nodules Back: symmetric, no curvature. ROM normal. No CVA tenderness. Lungs: clear to auscultation bilaterally Breasts: normal appearance, no masses or tenderness Heart: regular rate and rhythm, S1, S2 normal, no murmur, click, rub or gallop Abdomen: soft, non-tender; bowel sounds normal; no masses,  no organomegaly Pelvic: deferred Extremities: extremities normal, atraumatic, no cyanosis or edema Pulses: 2+ and symmetric Skin: Skin color, texture, turgor normal. No rashes or lesions Lymph nodes: Cervical, supraclavicular, and axillary nodes normal. Neurologic: Alert and oriented X 3, normal strength and tone. Normal symmetric reflexes. Normal coordination and gait Psych- no depression, no anxiety      Assessment:    Healthy female exam.      Plan:    ghm utd  Check labs See After Visit Summary for Counseling Recommendations   1. Preventative health care See above - Comp Met (CMET) - CBC with Differential/Platelet - Lipid panel - POCT urinalysis dipstick - TSH - HIV antibody - Hepatitis C antibody  2. Estrogen deficiency con't hrt - DG Bone Density; Future  3. Essential hypertension con't benicar - Comp Met (CMET) - CBC with Differential/Platelet - Lipid panel - POCT urinalysis dipstick - TSH

## 2015-05-06 NOTE — Progress Notes (Signed)
Pre visit review using our clinic review tool, if applicable. No additional management support is needed unless otherwise documented below in the visit note. 

## 2015-05-07 LAB — COMPREHENSIVE METABOLIC PANEL
ALBUMIN: 4.3 g/dL (ref 3.5–5.2)
ALT: 17 U/L (ref 0–35)
AST: 22 U/L (ref 0–37)
Alkaline Phosphatase: 67 U/L (ref 39–117)
BUN: 22 mg/dL (ref 6–23)
CALCIUM: 9.8 mg/dL (ref 8.4–10.5)
CHLORIDE: 98 meq/L (ref 96–112)
CO2: 28 meq/L (ref 19–32)
CREATININE: 0.87 mg/dL (ref 0.40–1.20)
GFR: 70.92 mL/min (ref >60.00)
Glucose, Bld: 82 mg/dL (ref 70–99)
POTASSIUM: 3.4 meq/L — AB (ref 3.5–5.1)
Sodium: 136 mEq/L (ref 135–145)
Total Bilirubin: 0.4 mg/dL (ref 0.2–1.2)
Total Protein: 7.7 g/dL (ref 6.0–8.3)

## 2015-05-07 LAB — CBC WITH DIFFERENTIAL/PLATELET
BASOS PCT: 0.8 % (ref 0.0–3.0)
Basophils Absolute: 0.1 10*3/uL (ref 0.0–0.1)
EOS ABS: 0.1 10*3/uL (ref 0.0–0.7)
Eosinophils Relative: 0.7 % (ref 0.0–5.0)
HEMATOCRIT: 38.8 % (ref 36.0–46.0)
HEMOGLOBIN: 12.8 g/dL (ref 12.0–15.0)
LYMPHS PCT: 22.3 % (ref 12.0–46.0)
Lymphs Abs: 2.6 10*3/uL (ref 0.7–4.0)
MCHC: 33 g/dL (ref 30.0–36.0)
MCV: 88.2 fl (ref 78.0–100.0)
MONOS PCT: 5.2 % (ref 3.0–12.0)
Monocytes Absolute: 0.6 10*3/uL (ref 0.1–1.0)
NEUTROS ABS: 8.2 10*3/uL — AB (ref 1.4–7.7)
Neutrophils Relative %: 71 % (ref 43.0–77.0)
PLATELETS: 375 10*3/uL (ref 150.0–400.0)
RBC: 4.4 Mil/uL (ref 3.87–5.11)
RDW: 13.2 % (ref 11.5–15.5)
WBC: 11.5 10*3/uL — AB (ref 4.0–10.5)

## 2015-05-07 LAB — LIPID PANEL
CHOL/HDL RATIO: 3
Cholesterol: 185 mg/dL (ref 0–200)
HDL: 65.3 mg/dL (ref >39.00)
LDL CALC: 101 mg/dL — AB (ref 0–99)
NONHDL: 120.15
TRIGLYCERIDES: 98 mg/dL (ref 0.0–149.0)
VLDL: 19.6 mg/dL (ref 0.0–40.0)

## 2015-05-07 LAB — HEPATITIS C ANTIBODY: HCV AB: NEGATIVE

## 2015-05-07 LAB — HIV ANTIBODY (ROUTINE TESTING W REFLEX): HIV: NONREACTIVE

## 2015-05-07 LAB — TSH: TSH: 2.63 u[IU]/mL (ref 0.35–4.50)

## 2015-05-16 ENCOUNTER — Ambulatory Visit (HOSPITAL_BASED_OUTPATIENT_CLINIC_OR_DEPARTMENT_OTHER)
Admission: RE | Admit: 2015-05-16 | Discharge: 2015-05-16 | Disposition: A | Discharge status: Home / Self Care | Payer: 59 | Source: Ambulatory Visit | Attending: Family Medicine | Admitting: Family Medicine

## 2015-05-16 DIAGNOSIS — E2839 Other primary ovarian failure: Secondary | ICD-10-CM

## 2015-05-16 DIAGNOSIS — Z1231 Encounter for screening mammogram for malignant neoplasm of breast: Secondary | ICD-10-CM | POA: Insufficient documentation

## 2015-06-09 ENCOUNTER — Ambulatory Visit (INDEPENDENT_AMBULATORY_CARE_PROVIDER_SITE_OTHER): Payer: BLUE CROSS/BLUE SHIELD | Admitting: Family

## 2015-06-09 ENCOUNTER — Encounter: Payer: Self-pay | Admitting: Family

## 2015-06-09 VITALS — BP 116/80 | HR 76 | Temp 98.0°F | Resp 16 | Ht 64.0 in | Wt 193.8 lb

## 2015-06-09 DIAGNOSIS — R3915 Urgency of urination: Secondary | ICD-10-CM | POA: Diagnosis not present

## 2015-06-09 LAB — POCT URINALYSIS DIPSTICK
BILIRUBIN UA: NEGATIVE
GLUCOSE UA: NEGATIVE
Leukocytes, UA: NEGATIVE
Nitrite, UA: NEGATIVE
Protein, UA: NEGATIVE
Urobilinogen, UA: NEGATIVE
pH, UA: 6

## 2015-06-09 MED ORDER — NITROFURANTOIN MONOHYD MACRO 100 MG PO CAPS: 100.0000 mg | ORAL_CAPSULE | Freq: Two times a day (BID) | ORAL | Status: DC

## 2015-06-09 NOTE — Progress Notes (Signed)
Subjective:    Patient ID: Grace Vaughn, female    DOB: 10-24-1955, 60 y.o.   MRN: [id number]  HPI  Ms. Vogel is a 59 yr old female who presents today with chief complaint of urinary urgency/frequency. Denies dysuria.  Reports that symptoms began 3 days ago and are associated with chills and across the low back pain.  She denies associated fever.    Review of Systems See HPI  Past Medical History  Diagnosis Date  . GERD (gastroesophageal reflux disease)   . Anxiety   . Vertigo   . Colon polyps   . Insomnia   . Hypertension     Social History   Social History  . Marital Status: Married    Spouse Name: N/A  . Number of Children: N/A  . Years of Education: N/A   Occupational History  .      [employer]   Social History Main Topics  . Smoking status: Never Smoker   . Smokeless tobacco: Never Used  . Alcohol Use: 1.5 oz/week    3 Standard drinks or equivalent per week  . Drug Use: No  . Sexual Activity: Yes   Other Topics Concern  . Not on file   Social History Narrative   Exercise--- walking 10,000 steps daily    Past Surgical History  Procedure Laterality Date  . Cesarean section  1986, 1989  . Abdominal hysterectomy  01/1999    Family History  Problem Relation Age of Onset  . Breast cancer Mother   . Heart disease Mother     cardiomyopathy  . Heart disease Father     CHF    Allergies  Allergen Reactions  . Augmentin [Amoxicillin-Pot Clavulanate]     ? reaction  . Cephalexin Nausea And Vomiting  . Erythromycin Nausea And Vomiting  . Lisinopril Cough  . Losartan     Pain in extremities, paresthesias     Current Outpatient Prescriptions on File Prior to Visit  Medication Sig Dispense Refill  . buPROPion (WELLBUTRIN SR) 150 MG 12 hr tablet Take 1 tablet (150 mg total) by mouth daily. 90 tablet 1  . clonazePAM (KLONOPIN) 1 MG tablet Take one tablet at hs prn 30 tablet 2  . estradiol (ESTRACE) 0.5 MG tablet Take 1 tablet (0.5 mg  total) by mouth daily. 90 tablet 1  . olmesartan-hydrochlorothiazide (BENICAR HCT) 40-25 MG per tablet Take 1 tablet by mouth daily. 90 tablet 1  . omeprazole (PRILOSEC) 40 MG capsule Take one tablet bid for 2 weeks then one daily 90 capsule 1   No current facility-administered medications on file prior to visit.    BP 116/80 mmHg  Pulse 76  Temp(Src) 98 F (36.7 C) (Oral)  Resp 16  Ht 5\' 4"  (1.626 m)  Wt 193 lb 12.8 oz (87.907 kg)  BMI 33.25 kg/m2  SpO2 100%       Objective:   Physical Exam  Constitutional: She appears well-developed and well-nourished.  Cardiovascular: Normal rate, regular rhythm and normal heart sounds.   No murmur heard. Pulmonary/Chest: Effort normal and breath sounds normal. No respiratory distress. She has no wheezes.  Abdominal: Soft. She exhibits no distension. There is no tenderness. There is no rebound and no CVA tenderness.  Musculoskeletal: She exhibits no edema.  Neurological: She is alert.  Psychiatric: She has a normal mood and affect. Her behavior is normal. Judgment and thought content normal.          Assessment & Plan:

## 2015-06-09 NOTE — Progress Notes (Signed)
Pre visit review using our clinic review tool, if applicable. No additional management support is needed unless otherwise documented below in the visit note.,h  

## 2015-06-09 NOTE — Patient Instructions (Signed)
Start macrobid for urinary tract infection.   Urinary Tract Infection Urinary tract infections (UTIs) can develop anywhere along your urinary tract. Your urinary tract is your body's drainage system for removing wastes and extra water. Your urinary tract includes two kidneys, two ureters, a bladder, and a urethra. Your kidneys are a pair of bean-shaped organs. Each kidney is about the size of your fist. They are located below your ribs, one on each side of your spine. CAUSES Infections are caused by microbes, which are microscopic organisms, including fungi, viruses, and bacteria. These organisms are so small that they can only be seen through a microscope. Bacteria are the microbes that most commonly cause UTIs. SYMPTOMS  Symptoms of UTIs may vary by age and gender of the patient and by the location of the infection. Symptoms in young women typically include a frequent and intense urge to urinate and a painful, burning feeling in the bladder or urethra during urination. Older women and men are more likely to be tired, shaky, and weak and have muscle aches and abdominal pain. A fever may mean the infection is in your kidneys. Other symptoms of a kidney infection include pain in your back or sides below the ribs, nausea, and vomiting. DIAGNOSIS To diagnose a UTI, your caregiver will ask you about your symptoms. Your caregiver will also ask you to provide a urine sample. The urine sample will be tested for bacteria and white blood cells. White blood cells are made by your body to help fight infection. TREATMENT  Typically, UTIs can be treated with medication. Because most UTIs are caused by a bacterial infection, they usually can be treated with the use of antibiotics. The choice of antibiotic and length of treatment depend on your symptoms and the type of bacteria causing your infection. HOME CARE INSTRUCTIONS  If you were prescribed antibiotics, take them exactly as your caregiver instructs you.  Finish the medication even if you feel better after you have only taken some of the medication.  Drink enough water and fluids to keep your urine clear or pale yellow.  Avoid caffeine, tea, and carbonated beverages. They tend to irritate your bladder.  Empty your bladder often. Avoid holding urine for long periods of time.  Empty your bladder before and after sexual intercourse.  After a bowel movement, women should cleanse from front to back. Use each tissue only once. SEEK MEDICAL CARE IF:   You have back pain.  You develop a fever.  Your symptoms do not begin to resolve within 3 days. SEEK IMMEDIATE MEDICAL CARE IF:   You have severe back pain or lower abdominal pain.  You develop chills.  You have nausea or vomiting.  You have continued burning or discomfort with urination. MAKE SURE YOU:   Understand these instructions.  Will watch your condition.  Will get help right away if you are not doing well or get worse.   This information is not intended to replace advice given to you by your health care provider. Make sure you discuss any questions you have with your health care provider.   Document Released: 04/14/2005 Document Revised: 03/26/2015 Document Reviewed: 08/13/2011 Elsevier Interactive Patient Education [employer].

## 2015-06-12 LAB — URINE CULTURE
Colony Count: NO GROWTH
ORGANISM ID, BACTERIA: NO GROWTH

## 2015-06-13 ENCOUNTER — Encounter: Payer: Self-pay | Admitting: Family

## 2015-06-30 ENCOUNTER — Other Ambulatory Visit: Payer: Self-pay | Admitting: Family Medicine

## 2015-06-30 NOTE — Telephone Encounter (Signed)
Last seen 05/06/15 and filled 03/18/15 #30 with 2 refills.   Please advise      KP

## 2015-07-04 ENCOUNTER — Encounter: Payer: Self-pay | Admitting: Medical

## 2015-07-04 ENCOUNTER — Ambulatory Visit (INDEPENDENT_AMBULATORY_CARE_PROVIDER_SITE_OTHER): Payer: BLUE CROSS/BLUE SHIELD | Admitting: Medical

## 2015-07-04 VITALS — BP 138/90 | HR 98 | Temp 98.0°F | Ht 64.0 in | Wt 190.8 lb

## 2015-07-04 DIAGNOSIS — J209 Acute bronchitis, unspecified: Secondary | ICD-10-CM | POA: Diagnosis not present

## 2015-07-04 DIAGNOSIS — S0121XA Laceration without foreign body of nose, initial encounter: Secondary | ICD-10-CM

## 2015-07-04 MED ORDER — HYDROCODONE-HOMATROPINE 5-1.5 MG/5ML PO SYRP: 5.0000 mL | ORAL_SOLUTION | Freq: Three times a day (TID) | ORAL | Status: DC | PRN

## 2015-07-04 MED ORDER — FLUTICASONE PROPIONATE 50 MCG/ACT NA SUSP: 2.0000 | Freq: Every day | NASAL | Status: DC

## 2015-07-04 MED ORDER — AZITHROMYCIN 250 MG PO TABS: ORAL_TABLET | ORAL | Status: DC

## 2015-07-04 NOTE — Progress Notes (Signed)
Subjective:    Patient ID: Grace Vaughn, female    DOB: 08-17-1955, 59 y.o.   MRN: [id number]  HPI  Pt in for for 4 days of dry cough. Sinus pressure, sneezing and some chest congestion. No ear pain. No itching. No hx of smoking. No asthma. Occasional uses inhaler when she get sick.  Also last night trunk of her car hit bridge of her nose. No loc. No confusion. No gross motor or sensory function deficits. She got small laceration to bridge of her nose.     Review of Systems  Constitutional: Negative for fever, chills and fatigue.  Respiratory: Negative for cough, chest tightness, shortness of breath and wheezing.   Gastrointestinal: Positive for vomiting. Negative for abdominal pain.       One time after brushing her teeth.   Musculoskeletal: Negative for back pain.  Skin: Negative for rash.  Neurological: Negative for dizziness, syncope, weakness, light-headedness, numbness and headaches.  Hematological: Negative for adenopathy. Does not bruise/bleed easily.  Psychiatric/Behavioral: Negative for behavioral problems and confusion.    Past Medical History  Diagnosis Date  . GERD (gastroesophageal reflux disease)   . Anxiety   . Vertigo   . Colon polyps   . Insomnia   . Hypertension     Social History   Social History  . Marital Status: Married    Spouse Name: N/A  . Number of Children: N/A  . Years of Education: N/A   Occupational History  .      [employer]   Social History Main Topics  . Smoking status: Never Smoker   . Smokeless tobacco: Never Used  . Alcohol Use: 1.5 oz/week    3 Standard drinks or equivalent per week  . Drug Use: No  . Sexual Activity: Yes   Other Topics Concern  . Not on file   Social History Narrative   Exercise--- walking 10,000 steps daily    Past Surgical History  Procedure Laterality Date  . Cesarean section  1986, 1989  . Abdominal hysterectomy  01/1999    Family History  Problem Relation Age of Onset    . Breast cancer Mother   . Heart disease Mother     cardiomyopathy  . Heart disease Father     CHF    Allergies  Allergen Reactions  . Augmentin [Amoxicillin-Pot Clavulanate]     ? reaction  . Cephalexin Nausea And Vomiting  . Erythromycin Nausea And Vomiting  . Lisinopril Cough  . Losartan     Pain in extremities, paresthesias     Current Outpatient Prescriptions on File Prior to Visit  Medication Sig Dispense Refill  . b complex vitamins tablet Take 1 tablet by mouth daily.    Marland Kitchen buPROPion (WELLBUTRIN SR) 150 MG 12 hr tablet Take 1 tablet (150 mg total) by mouth daily. 90 tablet 1  . cholecalciferol (VITAMIN D) 1000 UNITS tablet Take 1,000 Units by mouth daily.    . clonazePAM (KLONOPIN) 1 MG tablet take 1 tablet by mouth at bedtime if needed 30 tablet 2  . estradiol (ESTRACE) 0.5 MG tablet Take 1 tablet (0.5 mg total) by mouth daily. 90 tablet 1  . olmesartan-hydrochlorothiazide (BENICAR HCT) 40-25 MG per tablet Take 1 tablet by mouth daily. 90 tablet 1  . omeprazole (PRILOSEC) 40 MG capsule Take one tablet bid for 2 weeks then one daily 90 capsule 1  . vitamin B-12 (CYANOCOBALAMIN) 100 MCG tablet Take 100 mcg by mouth daily.  No current facility-administered medications on file prior to visit.    BP 138/90 mmHg  Pulse 98  Temp(Src) 98 F (36.7 C) (Oral)  Ht 5\' 4"  (1.626 m)  Wt 190 lb 12.8 oz (86.546 kg)  BMI 32.73 kg/m2  SpO2 95%       Objective:   Physical Exam  General  Mental Status - Alert. General Appearance - Well groomed. Not in acute distress.  Skin Rashes- No Rashes.  HEENT Head- Normal. Ear Auditory Canal - Left- Normal. Right - Normal.Tympanic Membrane- Left- Normal. Right- Normal. Eye Sclera/Conjunctiva- Left- Normal. Right- Normal. Nose & Sinuses Nasal Mucosa- Left-  Boggy and Congested. Right-  Boggy and  Congested.Bilateral no  maxillary and no  frontal sinus pressure. Mouth & Throat Lips: Upper Lip- Normal: no dryness, cracking,  pallor, cyanosis, or vesicular eruption. Lower Lip-Normal: no dryness, cracking, pallor, cyanosis or vesicular eruption. Buccal Mucosa- Bilateral- No Aphthous ulcers. Oropharynx- No Discharge or Erythema. +pnd Tonsils: Characteristics- Bilateral- No Erythema or Congestion. Size/Enlargement- Bilateral- No enlargement. Discharge- bilateral-None.  Neck Neck- Supple. No Masses.   Chest and Lung Exam Auscultation: Breath Sounds:-Clear even and unlabored.  Cardiovascular Auscultation:Rythm- Regular, rate and rhythm. Murmurs & Other Heart Sounds:Ausculatation of the heart reveal- No Murmurs.  Lymphatic Head & Neck General Head & Neck Lymphatics: Bilateral: Description- No Localized lymphadenopathy.  Nasal bridge small horizonatal 76mm laceration. Scabbed up already. No blood. No brusing surrounding. Good shape to nose. Not swollen. No battle signs. Nares patent.  Neurologic Cranial Nerve exam:- CN III-XII intact(No nystagmus), symmetric smile. Drift Test:- No drift. Romberg Exam:- Negative.  .Finger to Nose:- Normal/Intact Strength:- 5/5 equal and symmetric strength both upper and lower extremities.  Abd- soft, nt, nd, +bs, no rebound or guarding and no organomegaly.      Assessment & Plan:  Bronchitis may have been preceded by uri vs allergies.  Rx azithromycin antibiotic, flonase nasal spray and hycodan cough syrup.  For your nose laceration and contusion, I think this will heal quickly. No need to suture or glue also past time frame to use such. Use neosporin twice daily.  Based on exam will not get xray of nose.  If you get recurrent vomiting or any neurologic signs or symptom then advise go to ED for imaging. You excellent/normal neuro exam today.  Follow up in 7 days or as needed

## 2015-07-04 NOTE — Patient Instructions (Addendum)
Bronchitis may have been preceded by uri vs allergies.  Rx azithromycin antibiotic, flonase nasal spray and hycodan cough syrup.  For your nose laceration and contusion, I think this will heal quickly. No need to suture or glue also past time frame to use such. Use neosporin twice daily.  Based on exam will not get xray of nose.  If you get recurrent vomiting or any neurologic signs or symptom then advise go to ED for imaging. You excellent/normal neuro exam today.  Follow up in 7 days or as needed

## 2015-07-04 NOTE — Progress Notes (Signed)
Pre visit review using our clinic review tool, if applicable. No additional management support is needed unless otherwise documented below in the visit note. 

## 2015-07-10 ENCOUNTER — Ambulatory Visit (HOSPITAL_BASED_OUTPATIENT_CLINIC_OR_DEPARTMENT_OTHER)
Admission: RE | Admit: 2015-07-10 | Discharge: 2015-07-10 | Disposition: A | Discharge status: Home / Self Care | Payer: BLUE CROSS/BLUE SHIELD | Source: Ambulatory Visit | Attending: Medical | Admitting: Medical

## 2015-07-10 ENCOUNTER — Ambulatory Visit (INDEPENDENT_AMBULATORY_CARE_PROVIDER_SITE_OTHER): Payer: BLUE CROSS/BLUE SHIELD | Admitting: Medical

## 2015-07-10 ENCOUNTER — Encounter: Payer: Self-pay | Admitting: Medical

## 2015-07-10 VITALS — BP 120/80 | HR 78 | Temp 97.8°F | Ht 64.0 in | Wt 190.0 lb

## 2015-07-10 DIAGNOSIS — R059 Cough, unspecified: Secondary | ICD-10-CM

## 2015-07-10 DIAGNOSIS — R05 Cough: Secondary | ICD-10-CM

## 2015-07-10 DIAGNOSIS — R062 Wheezing: Secondary | ICD-10-CM

## 2015-07-10 MED ORDER — ALBUTEROL SULFATE HFA 108 (90 BASE) MCG/ACT IN AERS: 2.0000 | INHALATION_SPRAY | Freq: Four times a day (QID) | RESPIRATORY_TRACT | Status: DC | PRN

## 2015-07-10 MED ORDER — BECLOMETHASONE DIPROPIONATE 40 MCG/ACT IN AERS
2.0000 | INHALATION_SPRAY | Freq: Two times a day (BID) | RESPIRATORY_TRACT | Status: DC
Start: 2015-07-10 — End: 2016-07-15

## 2015-07-10 MED ORDER — PREDNISONE 20 MG PO TABS: ORAL_TABLET | ORAL | Status: DC

## 2015-07-10 MED ORDER — METHYLPREDNISOLONE ACETATE 40 MG/ML IJ SUSP
40.0000 mg | Freq: Once | INTRAMUSCULAR | Status: AC
  Administered 2015-07-10: 40 mg via INTRAMUSCULAR

## 2015-07-10 MED ORDER — DOXYCYCLINE HYCLATE 100 MG PO TABS: 100.0000 mg | ORAL_TABLET | Freq: Two times a day (BID) | ORAL | Status: DC

## 2015-07-10 MED ORDER — HYDROCOD POLST-CPM POLST ER 10-8 MG/5ML PO SUER: 5.0000 mL | Freq: Two times a day (BID) | ORAL | Status: DC | PRN

## 2015-07-10 NOTE — Telephone Encounter (Signed)
Antibiotic sent in event needs over the weekend.

## 2015-07-10 NOTE — Progress Notes (Signed)
Pre visit review using our clinic review tool, if applicable. No additional management support is needed unless otherwise documented below in the visit note. 

## 2015-07-10 NOTE — Patient Instructions (Addendum)
Your cough is severe. I think this is combination of pnd drainage cough and cough associated with your wheezing(found on exam).  I will rx tussionex syrup for cough.  For wheezing depomedrol 40 mg im.  Prednisone taper course.  qvar inhaler and albuterol inhaler.  Get cxr today.  May give other antibiotic after cxr result reviewed.  When around grandson wear mask.  Follow up in 6 days or as needed

## 2015-07-10 NOTE — Progress Notes (Signed)
Subjective:    Patient ID: Grace Vaughn, female    DOB: 04-25-1956, 59 y.o.   MRN: [id number]  HPI  Pt in for persisting cough. Pt was seen 6 days ago. She had bronchitis type symptoms. Pt was given azithromycin and hycodan syrup. Ran out of hycodan yesterday.   When pt exhales she does wheeze. Pt finished with the zpack.  Pt has history of inhaler use in past.  That time she was having severe cough episodes like this in the past. Pt states back then she saw pulmonolgist. This was in 2005. Pt has no significant hx of smoking.  Today the entire exam coughed virtually entire exam.  No fever, no chills and no sweats. Overall she states better except for coughing worsened and she had some expiratory wheezing at times.      Review of Systems  Constitutional: Negative for fever, chills and fatigue.  HENT: Positive for postnasal drip. Negative for congestion, ear pain, rhinorrhea, sinus pressure, sneezing and sore throat.   Respiratory: Positive for cough and wheezing. Negative for chest tightness.        See hpi  Cardiovascular: Negative for chest pain, palpitations and leg swelling.  Gastrointestinal: Negative for abdominal pain.  Musculoskeletal:       No leg pain.  Neurological: Negative for dizziness and headaches.  Hematological: Negative for adenopathy. Does not bruise/bleed easily.  Psychiatric/Behavioral: Negative for confusion and agitation.    Past Medical History  Diagnosis Date  . GERD (gastroesophageal reflux disease)   . Anxiety   . Vertigo   . Colon polyps   . Insomnia   . Hypertension     Social History   Social History  . Marital Status: Married    Spouse Name: N/A  . Number of Children: N/A  . Years of Education: N/A   Occupational History  .      [employer]   Social History Main Topics  . Smoking status: Never Smoker   . Smokeless tobacco: Never Used  . Alcohol Use: 1.5 oz/week    3 Standard drinks or equivalent per week  .  Drug Use: No  . Sexual Activity: Yes   Other Topics Concern  . Not on file   Social History Narrative   Exercise--- walking 10,000 steps daily    Past Surgical History  Procedure Laterality Date  . Cesarean section  1986, 1989  . Abdominal hysterectomy  01/1999    Family History  Problem Relation Age of Onset  . Breast cancer Mother   . Heart disease Mother     cardiomyopathy  . Heart disease Father     CHF    Allergies  Allergen Reactions  . Augmentin [Amoxicillin-Pot Clavulanate]     ? reaction  . Cephalexin Nausea And Vomiting  . Erythromycin Nausea And Vomiting  . Lisinopril Cough  . Losartan     Pain in extremities, paresthesias     Current Outpatient Prescriptions on File Prior to Visit  Medication Sig Dispense Refill  . Ascorbic Acid (VITAMIN C) 100 MG tablet Take 100 mg by mouth daily.    Marland Kitchen azithromycin (ZITHROMAX) 250 MG tablet Take 2 tablets by mouth on day 1, followed by 1 tablet by mouth daily for 4 days. 6 tablet 0  . b complex vitamins tablet Take 1 tablet by mouth daily.    Marland Kitchen buPROPion (WELLBUTRIN SR) 150 MG 12 hr tablet Take 1 tablet (150 mg total) by mouth daily. 90 tablet 1  .  cholecalciferol (VITAMIN D) 1000 UNITS tablet Take 1,000 Units by mouth daily.    . clonazePAM (KLONOPIN) 1 MG tablet take 1 tablet by mouth at bedtime if needed 30 tablet 2  . estradiol (ESTRACE) 0.5 MG tablet Take 1 tablet (0.5 mg total) by mouth daily. 90 tablet 1  . fluticasone (FLONASE) 50 MCG/ACT nasal spray Place 2 sprays into both nostrils daily. 16 g 1  . HYDROcodone-homatropine (HYCODAN) 5-1.5 MG/5ML syrup Take 5 mLs by mouth every 8 (eight) hours as needed for cough. 120 mL 0  . olmesartan-hydrochlorothiazide (BENICAR HCT) 40-25 MG per tablet Take 1 tablet by mouth daily. 90 tablet 1  . omeprazole (PRILOSEC) 40 MG capsule Take one tablet bid for 2 weeks then one daily 90 capsule 1  . vitamin B-12 (CYANOCOBALAMIN) 100 MCG tablet Take 100 mcg by mouth daily.    Marland Kitchen  doxycycline (VIBRA-TABS) 100 MG tablet Take 1 tablet (100 mg total) by mouth 2 (two) times daily. 20 tablet 0   No current facility-administered medications on file prior to visit.    BP 120/80 mmHg  Pulse 78  Temp(Src) 97.8 F (36.6 C) (Oral)  Ht 5\' 4"  (1.626 m)  Wt 190 lb (86.183 kg)  BMI 32.60 kg/m2  SpO2 97%       Objective:   Physical Exam  General  Mental Status - Alert. General Appearance - Well groomed. Not in acute distress. Severe dry cough.  Skin Rashes- No Rashes.  HEENT Head- Normal. Ear Auditory Canal - Left- Normal. Right - Normal.Tympanic Membrane- Left- Normal. Right- Normal. Eye Sclera/Conjunctiva- Left- Normal. Right- Normal. Nose & Sinuses Nasal Mucosa- Left-  Not boggy or Congested. Right-  Not  boggy or Congested. Mouth & Throat Lips: Upper Lip- Normal: no dryness, cracking, pallor, cyanosis, or vesicular eruption. Lower Lip-Normal: no dryness, cracking, pallor, cyanosis or vesicular eruption. Buccal Mucosa- Bilateral- No Aphthous ulcers. Oropharynx- No Discharge or Erythema. +pnd Tonsils: Characteristics- Bilateral- No Erythema or Congestion. Size/Enlargement- Bilateral- No enlargement. Discharge- bilateral-None.  Neck Neck- Supple. No Masses.   Chest and Lung Exam Auscultation: Breath Sounds:- even and unlabored, but faint rhonchi and mild expiratory wheeze upper lobes.  Cardiovascular Auscultation:Rythm- Regular, rate and rhythm. Murmurs & Other Heart Sounds:Ausculatation of the heart reveal- No Murmurs.  Lymphatic Head & Neck General Head & Neck Lymphatics: Bilateral: Description- No Localized lymphadenopathy.  Legs- negative homans sign. Symmetric on inspection.      Assessment & Plan:  Your cough is severe. I think this is combination of pnd drainage cough and cough associated with your wheezing(found on exam).  I will rx tussionex syrup for cough.  For wheezing depomedrol 40 mg im.  Prednisone taper course.  qvar  inhaler and albuterol inhaler.  Get cxr today.  May give other antibiotic after cxr result reviewed.  When around grandson wear mask.  Follow up in 6 days or as needed

## 2015-09-10 ENCOUNTER — Other Ambulatory Visit: Payer: Self-pay

## 2015-09-10 DIAGNOSIS — R232 Flushing: Secondary | ICD-10-CM

## 2015-09-10 MED ORDER — ESTRADIOL 0.5 MG PO TABS: 0.5000 mg | ORAL_TABLET | Freq: Every day | ORAL | Status: DC

## 2015-10-13 ENCOUNTER — Other Ambulatory Visit: Payer: Self-pay | Admitting: Family Medicine

## 2015-10-14 ENCOUNTER — Other Ambulatory Visit: Payer: Self-pay | Admitting: Family Medicine

## 2015-10-14 NOTE — Telephone Encounter (Signed)
Last seen 05/06/15 and filled 06/30/15 #30 with 2 refills.  Please advise    KP

## 2015-11-07 ENCOUNTER — Telehealth: Payer: Self-pay | Admitting: *Deleted

## 2015-11-07 NOTE — Telephone Encounter (Signed)
PA initiated, awaiting determination. JG//CMA  

## 2015-11-26 NOTE — Telephone Encounter (Signed)
Can be reached: [phone] Pharmacy: [id number], [place]  Reason for call: Advised pt benicar was requiring PA. She said that Dr. Coralyn Mark had to do PA before. One made her very sick and she was having neurological visits. W/in 7 days of being back on Benicar the sx went away. Pt is requesting call back.

## 2015-11-27 NOTE — Telephone Encounter (Signed)
PA approved effective 11/07/2015 through 11/06/2016 by [facility]. Approval letter sent for scanning. JG//CMA  Called and left detailed message for pt informing her.

## 2016-02-01 ENCOUNTER — Other Ambulatory Visit: Payer: Self-pay | Admitting: Family Medicine

## 2016-02-02 MED ORDER — BENICAR HCT 40-25 MG PO TABS: 1.0000 | ORAL_TABLET | Freq: Every day | ORAL | Status: DC

## 2016-02-02 NOTE — Telephone Encounter (Signed)
Rx sent to the pharmacy by e-script.  However only given #90.  The patient needs further evaluation and/or laboratory testing before further refills are given.  Ask patient to make an appointment for this.//AB/CMA  

## 2016-02-02 NOTE — Addendum Note (Signed)
Addended by: Ewing Schlein on: 02/02/2016 11:27 AM   Modules accepted: Orders

## 2016-02-04 ENCOUNTER — Other Ambulatory Visit: Payer: Self-pay | Admitting: Family Medicine

## 2016-02-05 NOTE — Telephone Encounter (Signed)
Pt is requesting refill on Clonazepam.  Last OV: 05/06/2015  Last Fill: 10/14/2015 #30 and 2RF UDS: None  Please advise.

## 2016-02-05 NOTE — Telephone Encounter (Signed)
Rx faxed to [facility] pharmacy.  

## 2016-02-05 NOTE — Telephone Encounter (Signed)
Rx printed, awaiting DO signature.  

## 2016-02-05 NOTE — Telephone Encounter (Signed)
Refill x1,  2 refills 

## 2016-03-02 ENCOUNTER — Other Ambulatory Visit: Payer: Self-pay | Admitting: Family Medicine

## 2016-03-02 DIAGNOSIS — R232 Flushing: Secondary | ICD-10-CM

## 2016-04-01 ENCOUNTER — Other Ambulatory Visit: Payer: Self-pay | Admitting: Family Medicine

## 2016-04-01 NOTE — Telephone Encounter (Signed)
lvm advising patient of message below °

## 2016-04-01 NOTE — Telephone Encounter (Signed)
Please offer this patient an appointment with Dr.Lowne for a follow up for HTN and anxiety.    KP

## 2016-05-01 ENCOUNTER — Other Ambulatory Visit: Payer: Self-pay | Admitting: Family Medicine

## 2016-05-01 DIAGNOSIS — R232 Flushing: Secondary | ICD-10-CM

## 2016-05-03 NOTE — Telephone Encounter (Signed)
Please schedule this patient an Office visit.      KP

## 2016-05-03 NOTE — Telephone Encounter (Signed)
lvm advising patient to schedule appointment °

## 2016-05-21 ENCOUNTER — Other Ambulatory Visit: Payer: Self-pay | Admitting: Family Medicine

## 2016-05-21 NOTE — Telephone Encounter (Signed)
Last seen 07/10/15 Last filled #30- 2 rf    02/05/16 Please advise PC

## 2016-05-21 NOTE — Telephone Encounter (Signed)
Rx faxed [phone] [facility]

## 2016-05-31 ENCOUNTER — Other Ambulatory Visit: Payer: Self-pay | Admitting: Family Medicine

## 2016-05-31 DIAGNOSIS — R232 Flushing: Secondary | ICD-10-CM

## 2016-06-08 ENCOUNTER — Ambulatory Visit (INDEPENDENT_AMBULATORY_CARE_PROVIDER_SITE_OTHER): Payer: 59 | Admitting: Family Medicine

## 2016-06-08 ENCOUNTER — Encounter: Payer: Self-pay | Admitting: Family Medicine

## 2016-06-08 VITALS — BP 124/60 | HR 91 | Temp 97.7°F | Ht 64.0 in | Wt 206.0 lb

## 2016-06-08 DIAGNOSIS — R202 Paresthesia of skin: Secondary | ICD-10-CM

## 2016-06-08 DIAGNOSIS — R200 Anesthesia of skin: Secondary | ICD-10-CM | POA: Insufficient documentation

## 2016-06-08 DIAGNOSIS — G4700 Insomnia, unspecified: Secondary | ICD-10-CM

## 2016-06-08 DIAGNOSIS — R0683 Snoring: Secondary | ICD-10-CM

## 2016-06-08 DIAGNOSIS — I10 Essential (primary) hypertension: Secondary | ICD-10-CM

## 2016-06-08 LAB — POCT URINALYSIS DIPSTICK
BILIRUBIN UA: NEGATIVE
Glucose, UA: NEGATIVE
KETONES UA: NEGATIVE
LEUKOCYTES UA: NEGATIVE
Nitrite, UA: NEGATIVE
PH UA: 5.5
PROTEIN UA: NEGATIVE
RBC UA: NEGATIVE
Urobilinogen, UA: 0.2

## 2016-06-08 NOTE — Assessment & Plan Note (Signed)
Pt goes to bed hugging a pillow and this makes numbness go away

## 2016-06-08 NOTE — Patient Instructions (Signed)
Hypertension Hypertension, commonly called high blood pressure, is when the force of blood pumping through your arteries is too strong. Your arteries are the blood vessels that carry blood from your heart throughout your body. A blood pressure reading consists of a higher number over a lower number, such as 110/72. The higher number (systolic) is the pressure inside your arteries when your heart pumps. The lower number (diastolic) is the pressure inside your arteries when your heart relaxes. Ideally you want your blood pressure below 120/80. Hypertension forces your heart to work harder to pump blood. Your arteries may become narrow or stiff. Having untreated or uncontrolled hypertension can cause heart attack, stroke, kidney disease, and other problems. What increases the risk? Some risk factors for high blood pressure are controllable. Others are not. Risk factors you cannot control include:  Race. You may be at higher risk if you are African American.  Age. Risk increases with age.  Gender. Men are at higher risk than women before age 45 years. After age 65, women are at higher risk than men. Risk factors you can control include:  Not getting enough exercise or physical activity.  Being overweight.  Getting too much fat, sugar, calories, or salt in your diet.  Drinking too much alcohol. What are the signs or symptoms? Hypertension does not usually cause signs or symptoms. Extremely high blood pressure (hypertensive crisis) may cause headache, anxiety, shortness of breath, and nosebleed. How is this diagnosed? To check if you have hypertension, your health care provider will measure your blood pressure while you are seated, with your arm held at the level of your heart. It should be measured at least twice using the same arm. Certain conditions can cause a difference in blood pressure between your right and left arms. A blood pressure reading that is higher than normal on one occasion does  not mean that you need treatment. If it is not clear whether you have high blood pressure, you may be asked to return on a different day to have your blood pressure checked again. Or, you may be asked to monitor your blood pressure at home for 1 or more weeks. How is this treated? Treating high blood pressure includes making lifestyle changes and possibly taking medicine. Living a healthy lifestyle can help lower high blood pressure. You may need to change some of your habits. Lifestyle changes may include:  Following the DASH diet. This diet is high in fruits, vegetables, and whole grains. It is low in salt, red meat, and added sugars.  Keep your sodium intake below 2,300 mg per day.  Getting at least 30-45 minutes of aerobic exercise at least 4 times per week.  Losing weight if necessary.  Not smoking.  Limiting alcoholic beverages.  Learning ways to reduce stress. Your health care provider may prescribe medicine if lifestyle changes are not enough to get your blood pressure under control, and if one of the following is true:  You are 18-59 years of age and your systolic blood pressure is above 140.  You are 60 years of age or older, and your systolic blood pressure is above 150.  Your diastolic blood pressure is above 90.  You have diabetes, and your systolic blood pressure is over 140 or your diastolic blood pressure is over 90.  You have kidney disease and your blood pressure is above 140/90.  You have heart disease and your blood pressure is above 140/90. Your personal target blood pressure may vary depending on your medical   conditions, your age, and other factors. Follow these instructions at home:  Have your blood pressure rechecked as directed by your health care provider.  Take medicines only as directed by your health care provider. Follow the directions carefully. Blood pressure medicines must be taken as prescribed. The medicine does not work as well when you skip  doses. Skipping doses also puts you at risk for problems.  Do not smoke.  Monitor your blood pressure at home as directed by your health care provider. Contact a health care provider if:  You think you are having a reaction to medicines taken.  You have recurrent headaches or feel dizzy.  You have swelling in your ankles.  You have trouble with your vision. Get help right away if:  You develop a severe headache or confusion.  You have unusual weakness, numbness, or feel faint.  You have severe chest or abdominal pain.  You vomit repeatedly.  You have trouble breathing. This information is not intended to replace advice given to you by your health care provider. Make sure you discuss any questions you have with your health care provider. Document Released: 07/05/2005 Document Revised: 12/11/2015 Document Reviewed: 04/27/2013 Elsevier Interactive Patient Education  2017 Elsevier Inc.  

## 2016-06-08 NOTE — Assessment & Plan Note (Signed)
Refer to neuro for sleep eval  

## 2016-06-08 NOTE — Progress Notes (Signed)
Patient ID: Grace Vaughn, female    DOB: 1956-05-30  Age: 60 y.o. MRN: [id number]    Subjective:  Subjective  HPI Grace Vaughn presents for  bp check.   She is also c/o snoring and trouble sleeping.  She also c/o numbness in arms-- worse in night.  She goes to bed hugging a pillow and this helps.   No other complaints.  Review of Systems  Constitutional: Negative for activity change, appetite change, fatigue and unexpected weight change.  Respiratory: Negative for cough and shortness of breath.   Cardiovascular: Negative for chest pain and palpitations.  Psychiatric/Behavioral: Negative for behavioral problems and dysphoric mood. The patient is not nervous/anxious.     History Past Medical History:  Diagnosis Date  . Anxiety   . Colon polyps   . GERD (gastroesophageal reflux disease)   . Hypertension   . Insomnia   . Vertigo     She has a past surgical history that includes Cesarean section ([place]) and Abdominal hysterectomy (01/1999).   Her family history includes Breast cancer in her mother; Heart disease in her father and mother.She reports that she has never smoked. She has never used smokeless tobacco. She reports that she drinks about 1.5 oz of alcohol per week . She reports that she does not use drugs.  Current Outpatient Prescriptions on File Prior to Visit  Medication Sig Dispense Refill  . albuterol (PROVENTIL HFA;VENTOLIN HFA) 108 (90 BASE) MCG/ACT inhaler Inhale 2 puffs into the lungs every 6 (six) hours as needed for wheezing or shortness of breath. 1 Inhaler 0  . Ascorbic Acid (VITAMIN C) 100 MG tablet Take 100 mg by mouth daily.    Marland Kitchen azithromycin (ZITHROMAX) 250 MG tablet Take 2 tablets by mouth on day 1, followed by 1 tablet by mouth daily for 4 days. 6 tablet 0  . b complex vitamins tablet Take 1 tablet by mouth daily.    . beclomethasone (QVAR) 40 MCG/ACT inhaler Inhale 2 puffs into the lungs 2 (two) times daily. 1 Inhaler 0  . BENICAR HCT 40-25 MG  tablet take 1 tablet by mouth once daily 30 tablet 0  . buPROPion (WELLBUTRIN SR) 150 MG 12 hr tablet take 1 tablet by mouth once daily 30 tablet 0  . chlorpheniramine-HYDROcodone (TUSSIONEX PENNKINETIC ER) 10-8 MG/5ML SUER Take 5 mLs by mouth every 12 (twelve) hours as needed for cough. 115 mL 0  . cholecalciferol (VITAMIN D) 1000 UNITS tablet Take 1,000 Units by mouth daily.    . clonazePAM (KLONOPIN) 1 MG tablet take 1 tablet by mouth at bedtime if needed 30 tablet 2  . estradiol (ESTRACE) 0.5 MG tablet take 1 tablet by mouth once daily . APPOINTMENT NEEDED 30 tablet 0  . fluticasone (FLONASE) 50 MCG/ACT nasal spray Place 2 sprays into both nostrils daily. 16 g 1  . omeprazole (PRILOSEC) 40 MG capsule take 1 capsule by mouth twice a day for 2 weeks then once daily 90 capsule 0  . vitamin B-12 (CYANOCOBALAMIN) 100 MCG tablet Take 100 mcg by mouth daily.     No current facility-administered medications on file prior to visit.      Objective:  Objective  Physical Exam  Constitutional: She is oriented to person, place, and time. She appears well-developed and well-nourished.  HENT:  Head: Normocephalic and atraumatic.  Eyes: Conjunctivae and EOM are normal.  Neck: Normal range of motion. Neck supple. No JVD present. Carotid bruit is not present. No thyromegaly present.  Cardiovascular:  Normal rate, regular rhythm and normal heart sounds.   No murmur heard. Pulmonary/Chest: Effort normal and breath sounds normal. No respiratory distress. She has no wheezes. She has no rales. She exhibits no tenderness.  Musculoskeletal: She exhibits no edema.  Neurological: She is alert and oriented to person, place, and time.  Psychiatric: She has a normal mood and affect.  Nursing note and vitals reviewed.  BP 124/60   Pulse 91   Temp 97.7 F (36.5 C) (Oral)   Ht 5\' 4"  (1.626 m)   Wt 206 lb (93.4 kg)   SpO2 97%   BMI 35.36 kg/m  Wt Readings from Last 3 Encounters:  06/08/16 206 lb (93.4 kg)    07/10/15 190 lb (86.2 kg)  07/04/15 190 lb 12.8 oz (86.5 kg)     Lab Results  Component Value Date   WBC 11.5 (H) 05/06/2015   HGB 12.8 05/06/2015   HCT 38.8 05/06/2015   PLT 375.0 05/06/2015   GLUCOSE 82 05/06/2015   CHOL 185 05/06/2015   TRIG 98.0 05/06/2015   HDL 65.30 05/06/2015   LDLCALC 101 (H) 05/06/2015   ALT 17 05/06/2015   AST 22 05/06/2015   NA 136 05/06/2015   K 3.4 (L) 05/06/2015   CL 98 05/06/2015   CREATININE 0.87 05/06/2015   BUN 22 05/06/2015   CO2 28 05/06/2015   TSH 2.63 05/06/2015    Dg Chest 2 View  Result Date: 07/10/2015 CLINICAL DATA:  60 year old female with cough and wheezing EXAM: CHEST  2 VIEW COMPARISON:  Radiograph dated 09/19/2013 caps FINDINGS: The heart size and mediastinal contours are within normal limits. Both lungs are clear. The visualized skeletal structures are unremarkable. Right upper quadrant cholecystectomy clips.  * IMPRESSION: No active cardiopulmonary disease. Electronically Signed   By: Anner Crete M.D.   On: 07/10/2015 19:13     Assessment & Plan:  Plan  I have discontinued Grace Vaughn's HYDROcodone-homatropine, predniSONE, and doxycycline. I am also having her maintain her vitamin B-12, b complex vitamins, cholecalciferol, vitamin C, fluticasone, azithromycin, chlorpheniramine-HYDROcodone, beclomethasone, albuterol, omeprazole, clonazePAM, estradiol, buPROPion, and BENICAR HCT.  No orders of the defined types were placed in this encounter.   Problem List Items Addressed This Visit      Unprioritized   Hypertension    Stable con't meds      Relevant Orders   Lipid panel   CBC with Differential/Platelet   Comprehensive metabolic panel   POCT urinalysis dipstick (Completed)   TSH   Insomnia    Refer to neuro for sleep eval      Numbness and tingling of both upper extremities while sleeping    Pt goes to bed hugging a pillow and this makes numbness go away      Relevant Orders   Lipid panel   CBC with  Differential/Platelet   Comprehensive metabolic panel   POCT urinalysis dipstick (Completed)   TSH    Other Visit Diagnoses    Snoring    -  Primary   Relevant Orders   Ambulatory referral to Neurology      Follow-up: Return for as scheduled for cpe.  Ann Held, DO

## 2016-06-08 NOTE — Progress Notes (Signed)
Pre visit review using our clinic tool,if applicable. No additional management support is needed unless otherwise documented below in the visit note.  

## 2016-06-08 NOTE — Assessment & Plan Note (Signed)
Stable con't meds 

## 2016-06-09 LAB — TSH: TSH: 3.86 u[IU]/mL (ref 0.35–4.50)

## 2016-06-09 LAB — LIPID PANEL
CHOL/HDL RATIO: 3
Cholesterol: 214 mg/dL — ABNORMAL HIGH (ref 0–200)
HDL: 70.6 mg/dL (ref >39.00)
LDL Cholesterol: 111 mg/dL — ABNORMAL HIGH (ref 0–99)
NONHDL: 143.74
Triglycerides: 162 mg/dL — ABNORMAL HIGH (ref 0.0–149.0)
VLDL: 32.4 mg/dL (ref 0.0–40.0)

## 2016-06-09 LAB — COMPREHENSIVE METABOLIC PANEL
ALBUMIN: 4.2 g/dL (ref 3.5–5.2)
ALK PHOS: 81 U/L (ref 39–117)
ALT: 14 U/L (ref 0–35)
AST: 17 U/L (ref 0–37)
BILIRUBIN TOTAL: 0.2 mg/dL (ref 0.2–1.2)
BUN: 22 mg/dL (ref 6–23)
CO2: 24 mEq/L (ref 19–32)
CREATININE: 0.79 mg/dL (ref 0.40–1.20)
Calcium: 9.4 mg/dL (ref 8.4–10.5)
Chloride: 102 mEq/L (ref 96–112)
GFR: 78.98 mL/min (ref >60.00)
GLUCOSE: 103 mg/dL — AB (ref 70–99)
POTASSIUM: 3.4 meq/L — AB (ref 3.5–5.1)
SODIUM: 137 meq/L (ref 135–145)
TOTAL PROTEIN: 7.3 g/dL (ref 6.0–8.3)

## 2016-06-09 LAB — CBC WITH DIFFERENTIAL/PLATELET
BASOS ABS: 0 10*3/uL (ref 0.0–0.1)
Basophils Relative: 0.4 % (ref 0.0–3.0)
EOS PCT: 0.3 % (ref 0.0–5.0)
Eosinophils Absolute: 0 10*3/uL (ref 0.0–0.7)
HCT: 39.3 % (ref 36.0–46.0)
HEMOGLOBIN: 13.2 g/dL (ref 12.0–15.0)
Lymphocytes Relative: 38.3 % (ref 12.0–46.0)
Lymphs Abs: 3.5 10*3/uL (ref 0.7–4.0)
MCHC: 33.7 g/dL (ref 30.0–36.0)
MCV: 87.3 fl (ref 78.0–100.0)
MONO ABS: 0.6 10*3/uL (ref 0.1–1.0)
MONOS PCT: 7 % (ref 3.0–12.0)
NEUTROS PCT: 54 % (ref 43.0–77.0)
Neutro Abs: 5 10*3/uL (ref 1.4–7.7)
Platelets: 365 10*3/uL (ref 150.0–400.0)
RBC: 4.5 Mil/uL (ref 3.87–5.11)
RDW: 13.7 % (ref 11.5–15.5)
WBC: 9.2 10*3/uL (ref 4.0–10.5)

## 2016-06-30 ENCOUNTER — Other Ambulatory Visit: Payer: Self-pay | Admitting: Family Medicine

## 2016-06-30 DIAGNOSIS — R232 Flushing: Secondary | ICD-10-CM

## 2016-07-15 ENCOUNTER — Ambulatory Visit (INDEPENDENT_AMBULATORY_CARE_PROVIDER_SITE_OTHER): Payer: 59 | Admitting: Neurology

## 2016-07-15 ENCOUNTER — Encounter: Payer: Self-pay | Admitting: Neurology

## 2016-07-15 VITALS — BP 128/74 | HR 78 | Resp 16 | Ht 64.0 in | Wt 208.0 lb

## 2016-07-15 DIAGNOSIS — F1320 Sedative, hypnotic or anxiolytic dependence, uncomplicated: Secondary | ICD-10-CM | POA: Diagnosis not present

## 2016-07-15 DIAGNOSIS — R0683 Snoring: Secondary | ICD-10-CM | POA: Diagnosis not present

## 2016-07-15 DIAGNOSIS — I10 Essential (primary) hypertension: Secondary | ICD-10-CM

## 2016-07-15 DIAGNOSIS — E6609 Other obesity due to excess calories: Secondary | ICD-10-CM | POA: Insufficient documentation

## 2016-07-15 NOTE — Progress Notes (Signed)
SLEEP MEDICINE CLINIC   Provider:  Larey Seat, M D  Referring Provider: Carollee Herter, Alferd Apa, * Primary Care Physician:  Ann Held, DO  Chief Complaint  Patient presents with  . Sleep Consult    Rm 10. Never had sleep study. Patient has trouble falling asleep and has trouble staying asleep without medication. Snores, wakes up choking/coughing, wakes up feeling tired, daytime fatigue.     HPI:  Grace Vaughn is a 60 y.o. female , seen here as a referral from Dr. Carollee Herter for a possible sleep study,  Grace Vaughn is a 60 year old Caucasian right-handed female, who presents with a chief complaint of snoring and trouble to sleep through the night she also feels that her arms turn numb at night. She describes an owner entrapment and feels relieved when she sleeps with the arms extended on a pillow or hugging a pillow. She has a history of gastroesophageal reflux disease, hypertension, insomnia, vertigo, anxiety, obesity,  PCOS, her BMI is 35.3. She gained weight over the 5 years of her mother's illness, while being her main caretaker. She was 125 pounds in her late thirties.   Sleep habits are as follows: She usually goes to bed between 1 and 2 AM, has always been late night active person. Her husband  watches TV in bed. She sometimes falls asleep watching TV in the evening hours but this interrupts her sleep pattern at night. She does have a interesting form of restless leg, sometimes has to get out of bed to walk to allow the dysesthesias to perform. She also cannot tolerate the blanket on her feet. She is using a heating blanket year-round but the bedroom so round temperature at school, quiet and dark. She falls asleep on her side, one hip pillow for head support and one to hug.  She has been using Klonopin to help her sleep, since 2000- sometimes when she is very tired she will not needed. Usually, she can sleep through the night. She does not report headaches, but her  eyes water.  She rises at 6.30, AM , needs her husband to wake her.   Sleep medical history and family sleep history:  Hysterectomy in her late thirties. Cholecystectomy, she underwent 2 C-sections and has 2 healthy adult children.rotatorcuff surgery.   Social history:  She has not used tobacco products, she drinks on social occasions alcohol but not regularly, she drinks coffee in the mornings and Coke in the daytime, 2-3 .  Used to be an active runner, gardener,   Review of Systems: Out of a complete 14 system review, the patient complains of only the following symptoms, and all other reviewed systems are negative. Snoring.  Epworth score  13 , Fatigue severity score 29  , depression score 2/15   Social History   Social History  . Marital status: Married    Spouse name: N/A  . Number of children: 2  . Years of education: BA   Occupational History  .      [employer]   Social History Main Topics  . Smoking status: Never Smoker  . Smokeless tobacco: Never Used  . Alcohol use Yes     Comment: rare  . Drug use: No  . Sexual activity: Yes   Other Topics Concern  . Not on file   Social History Narrative   Exercise--- walking 10,000 steps daily   Drinks 1-2 caffeine drinks a day     Family History  Problem  Relation Age of Onset  . Breast cancer Mother   . Heart disease Mother     cardiomyopathy  . Cataracts Mother   . Heart disease Father     CHF  . Cataracts Father     Past Medical History:  Diagnosis Date  . Anxiety   . Colon polyps   . GERD (gastroesophageal reflux disease)   . Hypertension   . Insomnia   . Vertigo     Past Surgical History:  Procedure Laterality Date  . ABDOMINAL HYSTERECTOMY  01/1999  . [place]  . CHOLECYSTECTOMY    . ROTATOR CUFF REPAIR Right     Current Outpatient Prescriptions  Medication Sig Dispense Refill  . BENICAR HCT 40-25 MG tablet take 1 tablet by mouth once daily 30 tablet 0  .  buPROPion (WELLBUTRIN SR) 150 MG 12 hr tablet take 1 tablet by mouth once daily 30 tablet 0  . cholecalciferol (VITAMIN D) 1000 UNITS tablet Take 1,000 Units by mouth daily.    . clonazePAM (KLONOPIN) 1 MG tablet take 1 tablet by mouth at bedtime if needed 30 tablet 2  . estradiol (ESTRACE) 0.5 MG tablet take 1 tablet by mouth once daily 30 tablet 0  . omeprazole (PRILOSEC) 40 MG capsule take 1 capsule by mouth twice a day for 2 weeks THEN ONCE DAILY 90 capsule 0   No current facility-administered medications for this visit.     Allergies as of 07/15/2016 - Review Complete 07/15/2016  Allergen Reaction Noted  . Augmentin [amoxicillin-pot clavulanate]  09/08/2011  . Cephalexin Nausea And Vomiting 04/01/2011  . Erythromycin Nausea And Vomiting 04/01/2011  . Lisinopril Cough 04/01/2011  . Losartan  04/01/2011    Vitals: BP 128/74   Pulse 78   Resp 16   Ht 5\' 4"  (1.626 m)   Wt 208 lb (94.3 kg)   BMI 35.70 kg/m  Last Weight:  Wt Readings from Last 1 Encounters:  07/15/16 208 lb (94.3 kg)   [id number] mass index is 35.7 kg/m.     Last Height:   Ht Readings from Last 1 Encounters:  07/15/16 5\' 4"  (1.626 m)    Physical exam:  General: The patient is awake, alert and appears not in acute distress. The patient is well groomed. Head: Normocephalic, atraumatic. Neck is supple. Mallampati 3 neck circumference: 14.5 . Nasal airflow patent , all natural teeth , crowns.  Cardiovascular:  Regular rate and rhythm , without  murmurs or carotid bruit, and without distended neck veins. Respiratory: Lungs are clear to auscultation. Skin:  Without evidence of edema, or rash Trunk: BMI is elevated  The patient's posture is erect.    Neurologic exam : The patient is awake and alert, oriented to place and time.   Memory subjective described as intact.  Attention span & concentration ability appears normal.  Speech is fluent,  without dysarthria, dysphonia or aphasia.  Mood and affect are  appropriate.  Cranial nerves: Pupils are equal and briskly reactive to light.  Funduscopic exam without evidence of pallor or edema. Extraocular movements  in vertical and horizontal planes intact and without nystagmus. Visual fields by finger perimetry are intact. Hearing to finger rub intact.   Facial sensation intact to fine touch.  Facial motor strength is symmetric and tongue and uvula move midline. Shoulder shrug was symmetrical.   Motor exam: Normal tone, muscle bulk and symmetric strength in all extremities. Sensory:  Proprioception tested in the upper extremities was normal. Coordination: Rapid  alternating movements in the fingers/hands was normal.  Finger-to-nose maneuver  normal without evidence of ataxia, dysmetria or tremor.  Gait and station: Patient walks without assistive device and is able unassisted to climb up to the exam table. Strength within normal limits.  Stance is stable and normal.  Deep tendon reflexes: in the  upper and lower extremities are symmetric and intact. Babinski maneuver response is  downgoing.  The patient was advised of the nature of the diagnosed sleep disorder , the treatment options and risks for general a health and wellness arising from not treating the condition.  I spent more than 35 minutes of face to face time with the patient. Greater than 50% of time was spent in counseling and coordination of care. We have discussed the diagnosis and differential and I answered the patient's questions.     Assessment:  After physical and neurologic examination, review of laboratory studies,  Personal review of imaging studies, reports of other /same  Imaging studies ,  Results of polysomnography/ neurophysiology testing and pre-existing records as far as provided in visit., my assessment is   1) witnessed snoring and apnea and daytime EDS and fatigue, risk factors are ankle edema,  reported sleep choking, weight gain and cholic abdominal  pain requiring her  some nights to sleep in a recliner. She has HTN.  2) attended sleep study , SPLIT protocol is ordered.   MD for RV in 6 weeks.     Grace Partridge Breckin Savannah MD  07/15/2016   CC: Ann Held, [address] [place], [place] [id number]

## 2016-07-20 ENCOUNTER — Ambulatory Visit (HOSPITAL_BASED_OUTPATIENT_CLINIC_OR_DEPARTMENT_OTHER)
Admission: RE | Admit: 2016-07-20 | Discharge: 2016-07-20 | Disposition: A | Discharge status: Home / Self Care | Payer: 59 | Source: Ambulatory Visit | Attending: Family Medicine | Admitting: Family Medicine

## 2016-07-20 ENCOUNTER — Encounter: Payer: Self-pay | Admitting: Family Medicine

## 2016-07-20 ENCOUNTER — Ambulatory Visit (INDEPENDENT_AMBULATORY_CARE_PROVIDER_SITE_OTHER): Payer: 59 | Admitting: Family Medicine

## 2016-07-20 ENCOUNTER — Other Ambulatory Visit: Payer: Self-pay | Admitting: Family Medicine

## 2016-07-20 VITALS — BP 120/70 | HR 99 | Temp 98.1°F | Resp 16 | Ht 64.0 in | Wt 206.8 lb

## 2016-07-20 DIAGNOSIS — Z1231 Encounter for screening mammogram for malignant neoplasm of breast: Secondary | ICD-10-CM

## 2016-07-20 DIAGNOSIS — F32A Depression, unspecified: Secondary | ICD-10-CM

## 2016-07-20 DIAGNOSIS — K219 Gastro-esophageal reflux disease without esophagitis: Secondary | ICD-10-CM

## 2016-07-20 DIAGNOSIS — Z0000 Encounter for general adult medical examination without abnormal findings: Secondary | ICD-10-CM

## 2016-07-20 DIAGNOSIS — I10 Essential (primary) hypertension: Secondary | ICD-10-CM | POA: Diagnosis not present

## 2016-07-20 DIAGNOSIS — F329 Major depressive disorder, single episode, unspecified: Secondary | ICD-10-CM

## 2016-07-20 MED ORDER — BENICAR HCT 40-25 MG PO TABS: 1.0000 | ORAL_TABLET | Freq: Every day | ORAL | 0 refills | Status: DC

## 2016-07-20 MED ORDER — OMEPRAZOLE 40 MG PO CPDR: DELAYED_RELEASE_CAPSULE | ORAL | 0 refills | Status: DC

## 2016-07-20 MED ORDER — BUPROPION HCL ER (SR) 150 MG PO TB12: 150.0000 mg | ORAL_TABLET | Freq: Every day | ORAL | 0 refills | Status: DC

## 2016-07-20 NOTE — Progress Notes (Signed)
Subjective:     Grace Vaughn is a 61 y.o. female and is here for a comprehensive physical exam. The patient reports no problems.  Social History   Social History  . Marital status: Married    Spouse name: N/A  . Number of children: 2  . Years of education: BA   Occupational History  .      [employer]   Social History Main Topics  . Smoking status: Never Smoker  . Smokeless tobacco: Never Used  . Alcohol use Yes     Comment: rare  . Drug use: No  . Sexual activity: Yes   Other Topics Concern  . Not on file   Social History Narrative   Exercise--- walking 10,000 steps daily   Drinks 1-2 caffeine drinks a day    Health Maintenance  Topic Date Due  . PAP SMEAR  05/01/2017  . MAMMOGRAM  05/15/2017  . COLONOSCOPY  07/18/2020  . TETANUS/TDAP  05/05/2025  . INFLUENZA VACCINE  Addressed  . Hepatitis C Screening  Completed  . HIV Screening  Completed    The following portions of the patient's history were reviewed and updated as appropriate: She  has a past medical history of Anxiety; Colon polyps; GERD (gastroesophageal reflux disease); Hypertension; Insomnia; and Vertigo. She  does not have any pertinent problems on file. She  has a past surgical history that includes Cesarean section (1986, 1989); Abdominal hysterectomy (01/1999); Cholecystectomy; and Rotator cuff repair (Right). Her family history includes Breast cancer in her mother; Cataracts in her father and mother; Heart disease in her father and mother. She  reports that she has never smoked. She has never used smokeless tobacco. She reports that she drinks alcohol. She reports that she does not use drugs. She has a current medication list which includes the following prescription(s): benicar hct, bupropion, cholecalciferol, clonazepam, estradiol, and omeprazole. Current Outpatient Prescriptions on File Prior to Visit  Medication Sig Dispense Refill  . cholecalciferol (VITAMIN D) 1000 UNITS tablet  Take 1,000 Units by mouth daily.    . clonazePAM (KLONOPIN) 1 MG tablet take 1 tablet by mouth at bedtime if needed 30 tablet 2  . estradiol (ESTRACE) 0.5 MG tablet take 1 tablet by mouth once daily 30 tablet 0   No current facility-administered medications on file prior to visit.    She is allergic to augmentin [amoxicillin-pot clavulanate]; cephalexin; erythromycin; lisinopril; and losartan..  Review of Systems Review of Systems  Constitutional: Negative for activity change, appetite change and fatigue.  HENT: Negative for hearing loss, congestion, tinnitus and ear discharge.  dentist q28m Eyes: Negative for visual disturbance (see optho q1y -- vision corrected to 20/20 with glasses).  Respiratory: Negative for cough, chest tightness and shortness of breath.   Cardiovascular: Negative for chest pain, palpitations and leg swelling.  Gastrointestinal: Negative for abdominal pain, diarrhea, constipation and abdominal distention.  Genitourinary: Negative for urgency, frequency, decreased urine volume and difficulty urinating.  Musculoskeletal: Negative for back pain, arthralgias and gait problem.  Skin: Negative for color change, pallor and rash.  Neurological: Negative for dizziness, light-headedness, numbness and headaches.  Hematological: Negative for adenopathy. Does not bruise/bleed easily.  Psychiatric/Behavioral: Negative for suicidal ideas, confusion, sleep disturbance, self-injury, dysphoric mood, decreased concentration and agitation.       Objective:    BP 120/70 (BP Location: Left Arm, Cuff Size: Large)   Pulse 99   Temp 98.1 F (36.7 C) (Oral)   Resp 16   Ht 5\' 4"  (  1.626 m)   Wt 206 lb 12.8 oz (93.8 kg)   SpO2 98%   BMI 35.50 kg/m  General appearance: alert, cooperative, appears stated age and no distress Head: Normocephalic, without obvious abnormality, atraumatic Eyes: conjunctivae/corneas clear. PERRL, EOM's intact. Fundi benign. Ears: normal TM's and external  ear canals both ears Nose: Nares normal. Septum midline. Mucosa normal. No drainage or sinus tenderness. Throat: lips, mucosa, and tongue normal; teeth and gums normal Neck: no adenopathy, no carotid bruit, no JVD, supple, symmetrical, trachea midline and thyroid not enlarged, symmetric, no tenderness/mass/nodules Back: symmetric, no curvature. ROM normal. No CVA tenderness. Lungs: clear to auscultation bilaterally Breasts: normal appearance, no masses or tenderness Heart: regular rate and rhythm, S1, S2 normal, no murmur, click, rub or gallop Abdomen: soft, non-tender; bowel sounds normal; no masses,  no organomegaly Pelvic: deferred Extremities: extremities normal, atraumatic, no cyanosis or edema Pulses: 2+ and symmetric Skin: Skin color, texture, turgor normal. No rashes or lesions Lymph nodes: Cervical, supraclavicular, and axillary nodes normal. Neurologic: Alert and oriented X 3, normal strength and tone. Normal symmetric reflexes. Normal coordination and gait   Assessment:    Healthy female exam.     Plan:    ghm utd Check labs See After Visit Summary for Counseling Recommendations    1. Essential hypertension stable - BENICAR HCT 40-25 MG tablet; Take 1 tablet by mouth daily.  Dispense: 30 tablet; Refill: 0  2. Depression, unspecified depression type stable - buPROPion (WELLBUTRIN SR) 150 MG 12 hr tablet; Take 1 tablet (150 mg total) by mouth daily.  Dispense: 30 tablet; Refill: 0  3. Gastroesophageal reflux disease, esophagitis presence not specified  - omeprazole (PRILOSEC) 40 MG capsule; take 1 capsule by mouth twice a day for 2 weeks THEN ONCE DAILY  Dispense: 90 capsule; Refill: 0  4. Preventative health care ghm utd See above

## 2016-07-20 NOTE — Progress Notes (Signed)
Pre visit review using our clinic review tool, if applicable. No additional management support is needed unless otherwise documented below in the visit note. 

## 2016-07-20 NOTE — Patient Instructions (Signed)

## 2016-07-25 DIAGNOSIS — I10 Essential (primary) hypertension: Secondary | ICD-10-CM | POA: Diagnosis not present

## 2016-07-25 DIAGNOSIS — Z0000 Encounter for general adult medical examination without abnormal findings: Secondary | ICD-10-CM | POA: Diagnosis not present

## 2016-07-25 DIAGNOSIS — Z23 Encounter for immunization: Secondary | ICD-10-CM

## 2016-07-25 DIAGNOSIS — Z79899 Other long term (current) drug therapy: Secondary | ICD-10-CM | POA: Diagnosis not present

## 2016-07-27 DIAGNOSIS — H04123 Dry eye syndrome of bilateral lacrimal glands: Secondary | ICD-10-CM | POA: Diagnosis not present

## 2016-07-27 DIAGNOSIS — H04203 Unspecified epiphora, bilateral lacrimal glands: Secondary | ICD-10-CM | POA: Diagnosis not present

## 2016-07-30 ENCOUNTER — Other Ambulatory Visit: Payer: Self-pay | Admitting: Family Medicine

## 2016-07-30 DIAGNOSIS — R232 Flushing: Secondary | ICD-10-CM

## 2016-08-02 ENCOUNTER — Other Ambulatory Visit: Payer: Self-pay | Admitting: Family Medicine

## 2016-08-03 DIAGNOSIS — S39012A Strain of muscle, fascia and tendon of lower back, initial encounter: Secondary | ICD-10-CM | POA: Diagnosis not present

## 2016-08-03 DIAGNOSIS — M62830 Muscle spasm of back: Secondary | ICD-10-CM | POA: Diagnosis not present

## 2016-08-29 ENCOUNTER — Other Ambulatory Visit: Payer: Self-pay | Admitting: Family Medicine

## 2016-08-29 DIAGNOSIS — R232 Flushing: Secondary | ICD-10-CM

## 2016-08-30 ENCOUNTER — Ambulatory Visit (INDEPENDENT_AMBULATORY_CARE_PROVIDER_SITE_OTHER): Payer: 59 | Admitting: Neurology

## 2016-08-30 DIAGNOSIS — E6609 Other obesity due to excess calories: Secondary | ICD-10-CM

## 2016-08-30 DIAGNOSIS — G4733 Obstructive sleep apnea (adult) (pediatric): Secondary | ICD-10-CM

## 2016-08-30 DIAGNOSIS — R0683 Snoring: Secondary | ICD-10-CM

## 2016-08-30 DIAGNOSIS — E66812 Obesity, class 2: Secondary | ICD-10-CM

## 2016-08-30 DIAGNOSIS — F1320 Sedative, hypnotic or anxiolytic dependence, uncomplicated: Secondary | ICD-10-CM

## 2016-08-30 DIAGNOSIS — I10 Essential (primary) hypertension: Secondary | ICD-10-CM

## 2016-09-01 ENCOUNTER — Telehealth: Payer: Self-pay | Admitting: Neurology

## 2016-09-01 DIAGNOSIS — G4734 Idiopathic sleep related nonobstructive alveolar hypoventilation: Secondary | ICD-10-CM

## 2016-09-01 DIAGNOSIS — G4733 Obstructive sleep apnea (adult) (pediatric): Secondary | ICD-10-CM

## 2016-09-01 NOTE — Procedures (Signed)
[facility], [address], [place], [place] [address] NAME: Grace Vaughn. Rehm DOB: June 24, 1956 MEDICAL RECORD [id number] DOS: 08/30/16 REFERRING PHYSICIAN: Roma Schanz, DO Study performed: HST/Out of Center Sleep test HISTORY: Grace Vaughn is a 61 year old Caucasian right-handed female, who presents with a chief complaint of snoring and trouble to sleep through the night. She also feels that her arms turn numb at night. She describes an ulnar nerve entrapment and feels better when she sleeps with her arms extended or hugging a pillow. She has a history of gastroesophageal reflux disease, hypertension, insomnia, vertigo, anxiety, obesity, PCOS, her BMI is 35.3. She gained weight over the last 5 years of her mother's illness, while being her main caretaker. She weighed 125 pounds 20 years ago.    Epworth Sleepiness Score 13/24 points, Fatigue severity score 29 , PHQ 12 depression score 5    STUDY RESULTS: Total Recording Time: 5h 58m    Total Apnea/Hypopnea Index (AHI) 6.4/hr.  Average Oxygen Saturation: SpO2 91% Nadir SpO2 /Lowest Oxygen Saturation: 74% Duration of hypoxemia: 76 min at or below SpO2 89% and 30 Min. at or below 88%  Average Heart Rate:  NSR at 88 bpm, no tachy or bradycardia.   IMPRESSION: Mild apnea and moderate snoring, but clinically significant desaturation of SpO2, qualifying as hypoxemia. This can explain excessive daytime sleepiness, and degree of fatigue.  Plan:  This patient shall return for a full night attended sleep study to verify the degree of OSA,  evaluate for REM dependence in OSA and associated hypoxemia, and titrate to CPAP. Attended sleep studies allow for possible use of oxygen.    RECOMMENDATION:  SPLIT study I certify that I have reviewed the raw data recording prior to the issuance of this report in accordance with the standards of Accreditation of the American Academy of Sleep medicine (AASM) Larey Seat, MD    09-01-2016 Diplomat, American Board of Psychiatry and Neurology Diplomat, American Board of Sleep Medicine Medical Director of [facility] at [facility], an [employer] accredited facility

## 2016-09-01 NOTE — Addendum Note (Signed)
Addended by: Larey Seat on: 09/01/2016 04:37 PM   Modules accepted: Orders

## 2016-09-01 NOTE — Telephone Encounter (Signed)
LM for patient to call back.

## 2016-09-01 NOTE — Telephone Encounter (Signed)
Insurance denied proper attended sleep study for titration.

## 2016-09-01 NOTE — Telephone Encounter (Signed)
[employer] denied CPAP suggest autopap. °

## 2016-09-01 NOTE — Telephone Encounter (Signed)
-----   Message from Larey Seat, MD sent at 09/01/2016  1:24 PM EST ----- STUDY RESULTS: Total Recording Time: 5h 74m    Total Apnea/Hypopnea Index (AHI) 6.4/hr.  Average Oxygen Saturation: SpO2 91% Nadir SpO2 /Lowest Oxygen Saturation: 74% Duration of hypoxemia: 76 min at or below SpO2 89% and 30 Min. at or below 88%  Average Heart Rate:  NSR at 88 bpm, no tachy or bradycardia.   IMPRESSION: Mild apnea and moderate snoring, but clinically significant desaturation of SpO2, qualifying as hypoxemia. This can explain excessive daytime sleepiness, and degree of fatigue.  Plan:  This patient shall return for a full night attended sleep study to verify the degree of OSA, evaluate for REM dependence in OSA and associated hypoxemia, and titrate to CPAP. Attended sleep studies allow for possible use of oxygen.    RECOMMENDATION:  SPLIT study

## 2016-09-01 NOTE — Telephone Encounter (Signed)
I want this patient to return for a CPAP titration with one hour baseline sleep, her OSA was mild, but asssociated with hypoxia and her sleepiness degree is higher than expected with mild apnea.

## 2016-09-02 ENCOUNTER — Telehealth: Payer: Self-pay

## 2016-09-02 NOTE — Telephone Encounter (Signed)
I spoke to patient. She is willing to try autopap. I will send orders to [facility]. Patient was unable to make an appt at this time due to her school schedule will change soon. I will send copy to PCP. Patient will get a letter reminind her to make f/u appt and stress the importance of compliance.

## 2016-09-02 NOTE — Telephone Encounter (Signed)
-----   Message from Larey Seat, MD sent at 09/01/2016  1:24 PM EST ----- STUDY RESULTS: Total Recording Time: 5h 66m    Total Apnea/Hypopnea Index (AHI) 6.4/hr.  Average Oxygen Saturation: SpO2 91% Nadir SpO2 /Lowest Oxygen Saturation: 74% Duration of hypoxemia: 76 min at or below SpO2 89% and 30 Min. at or below 88%  Average Heart Rate:  NSR at 88 bpm, no tachy or bradycardia.   IMPRESSION: Mild apnea and moderate snoring, but clinically significant desaturation of SpO2, qualifying as hypoxemia. This can explain excessive daytime sleepiness, and degree of fatigue.  Plan:  This patient shall return for a full night attended sleep study to verify the degree of OSA, evaluate for REM dependence in OSA and associated hypoxemia, and titrate to CPAP. Attended sleep studies allow for possible use of oxygen.    RECOMMENDATION:  SPLIT study

## 2016-09-05 ENCOUNTER — Other Ambulatory Visit: Payer: Self-pay | Admitting: Family Medicine

## 2016-09-06 ENCOUNTER — Other Ambulatory Visit: Payer: Self-pay | Admitting: Family Medicine

## 2016-09-06 NOTE — Telephone Encounter (Signed)
rx has already been approved by PCP. Printed and faxed

## 2016-09-06 NOTE — Telephone Encounter (Signed)
Last seen 07/20/2016  Last filled 05/21/2016 [id number]  Sig: take 1 tablet po qhs  Please advise  PC

## 2016-09-06 NOTE — Telephone Encounter (Signed)
rx signed and faxed   PC 

## 2016-09-14 ENCOUNTER — Telehealth: Payer: Self-pay

## 2016-09-14 NOTE — Telephone Encounter (Signed)
PA initiated via Covermymeds; KEY: [id number]. Awaiting determination.

## 2016-09-15 DIAGNOSIS — G4733 Obstructive sleep apnea (adult) (pediatric): Secondary | ICD-10-CM | POA: Diagnosis not present

## 2016-09-17 NOTE — Telephone Encounter (Signed)
Received PA denial. Reference number: [id number]. Awaiting PA notification letter.

## 2016-09-17 NOTE — Telephone Encounter (Signed)
If pt ok --- diovan hct 160/ 12.5   1 po qd, #30  2 refills bp check in 2-3 weeks

## 2016-09-17 NOTE — Telephone Encounter (Signed)
Received PA denial notification letter. Pt must try and fail all of the following for Benicar HCT: Candesartan HCT (generic Atacand) Edarbyclor Irbesartan HCT (generic Avalide) Losartan HCT (generic Hyzaar)-Pt has adverse reaction hx Valsartan HCT (generic Diovan HCT)  Please advise.

## 2016-09-20 NOTE — Telephone Encounter (Signed)
Tried calling Pt, no answer, no answering machine picked up. Will try calling again later.

## 2016-09-22 NOTE — Telephone Encounter (Signed)
Tried calling Pt again, no answer, not able to leave message. Will forward to PCP CMA for follow-up.

## 2016-09-23 NOTE — Telephone Encounter (Signed)
[phone] (M)---called mobile number left detailed message to call back. [phone] (H)--call home number no answer/no voice message

## 2016-09-23 NOTE — Telephone Encounter (Signed)
Patient called back and as it turns out you have another insurance and they are paying for the Benicar for now.

## 2016-10-13 DIAGNOSIS — G4733 Obstructive sleep apnea (adult) (pediatric): Secondary | ICD-10-CM | POA: Diagnosis not present

## 2016-11-01 ENCOUNTER — Other Ambulatory Visit: Payer: Self-pay | Admitting: Family Medicine

## 2016-11-13 DIAGNOSIS — G4733 Obstructive sleep apnea (adult) (pediatric): Secondary | ICD-10-CM | POA: Diagnosis not present

## 2016-11-23 ENCOUNTER — Telehealth: Payer: Self-pay | Admitting: Family Medicine

## 2016-11-23 MED ORDER — VALSARTAN-HYDROCHLOROTHIAZIDE 160-12.5 MG PO TABS
1.0000 | ORAL_TABLET | Freq: Every day | ORAL | 1 refills | Status: DC
Start: 2016-11-23 — End: 2017-01-03

## 2016-11-23 NOTE — Telephone Encounter (Signed)
Updated medication list and sent in diovan Patient notified and did schedule nurse visit for 12/16/16 at 9 AM

## 2016-11-23 NOTE — Telephone Encounter (Signed)
Caller name: Relationship to patient: Self Can be reached: [phone]  Pharmacy:  [facility] [place], [place] [phone] (Phone) [phone] (Fax)     Reason for call: Patient request a Rx for Diovan/HCTZ because her insurance will not pay for the Benicar anymore.

## 2016-11-23 NOTE — Telephone Encounter (Signed)
diovan 160 / 12.5  #30  1 po qd ----  bp check 2-3 weeks

## 2016-12-01 ENCOUNTER — Ambulatory Visit (HOSPITAL_BASED_OUTPATIENT_CLINIC_OR_DEPARTMENT_OTHER)
Admission: RE | Admit: 2016-12-01 | Discharge: 2016-12-01 | Disposition: A | Discharge status: Home / Self Care | Payer: 59 | Source: Ambulatory Visit | Attending: Medical | Admitting: Medical

## 2016-12-01 ENCOUNTER — Ambulatory Visit (INDEPENDENT_AMBULATORY_CARE_PROVIDER_SITE_OTHER): Payer: 59 | Admitting: Medical

## 2016-12-01 VITALS — BP 124/82 | HR 86 | Temp 98.1°F | Resp 14 | Ht 64.0 in | Wt 208.0 lb

## 2016-12-01 DIAGNOSIS — R059 Cough, unspecified: Secondary | ICD-10-CM

## 2016-12-01 DIAGNOSIS — J40 Bronchitis, not specified as acute or chronic: Secondary | ICD-10-CM

## 2016-12-01 DIAGNOSIS — R05 Cough: Secondary | ICD-10-CM | POA: Insufficient documentation

## 2016-12-01 DIAGNOSIS — I700 Atherosclerosis of aorta: Secondary | ICD-10-CM | POA: Insufficient documentation

## 2016-12-01 DIAGNOSIS — Z87898 Personal history of other specified conditions: Secondary | ICD-10-CM | POA: Diagnosis not present

## 2016-12-01 DIAGNOSIS — R079 Chest pain, unspecified: Secondary | ICD-10-CM | POA: Diagnosis not present

## 2016-12-01 MED ORDER — HYDROCODONE-HOMATROPINE 5-1.5 MG/5ML PO SYRP: 5.0000 mL | ORAL_SOLUTION | Freq: Three times a day (TID) | ORAL | 0 refills | Status: DC | PRN

## 2016-12-01 MED ORDER — PREDNISONE 10 MG PO TABS: ORAL_TABLET | ORAL | 0 refills | Status: DC

## 2016-12-01 MED ORDER — AZITHROMYCIN 250 MG PO TABS: ORAL_TABLET | ORAL | 0 refills | Status: DC

## 2016-12-01 NOTE — Patient Instructions (Addendum)
You appear to have bronchitis. Rest hydrate and tylenol for fever. I am prescribing cough medicine hydocan, and azithromycin antibiotic.   For wheezing or severe cough can try your  inhalers. I am providing tapered prednisone to use over weekend in event wheezing  while on vacation.  Please get cxr today  Follow up in 7-10 days or as needed

## 2016-12-01 NOTE — Progress Notes (Signed)
Subjective:    Patient ID: Grace Vaughn, female    DOB: 1956-04-16, 61 y.o.   MRN: [id number]  HPI  Pt in with some recent cough since Thursday. Started out mild st and mild cough. But now cough is day and night. Pt states can't sleep due to cough. Rare productive. Pt has mild runny nose. Pt states as she has progressively got worse cough has more st and chest wall hurts when she cough. Pt not hearing wheezing. Pt is using her albuterol but still coughing. Also tried qvar.   Pt coughs started after her husband came home from hospital. She was with him for numerous days.    Review of Systems  Constitutional: Negative for chills, fatigue and fever.  HENT: Positive for postnasal drip and sore throat. Negative for congestion, facial swelling, mouth sores, sinus pressure and sneezing.        Mild pnd by exam  Respiratory: Positive for cough. Negative for chest tightness and shortness of breath.   Cardiovascular: Negative for chest pain and palpitations.  Gastrointestinal: Negative for abdominal pain, constipation, nausea and vomiting.  Musculoskeletal: Negative for back pain.  Skin: Negative for rash.  Neurological: Negative for dizziness and headaches.  Hematological: Negative for adenopathy. Does not bruise/bleed easily.  Psychiatric/Behavioral: Negative for behavioral problems and confusion. The patient is not nervous/anxious.     Past Medical History:  Diagnosis Date  . Anxiety   . Colon polyps   . GERD (gastroesophageal reflux disease)   . Hypertension   . Insomnia   . Vertigo      Social History   Social History  . Marital status: Married    Spouse name: N/A  . Number of children: 2  . Years of education: BA   Occupational History  .      [employer]   Social History Main Topics  . Smoking status: Never Smoker  . Smokeless tobacco: Never Used  . Alcohol use Yes     Comment: rare  . Drug use: No  . Sexual activity: Yes   Other Topics Concern    . Not on file   Social History Narrative   Exercise--- walking 10,000 steps daily   Drinks 1-2 caffeine drinks a day     Past Surgical History:  Procedure Laterality Date  . ABDOMINAL HYSTERECTOMY  01/1999  . [place]  . CHOLECYSTECTOMY    . ROTATOR CUFF REPAIR Right     Family History  Problem Relation Age of Onset  . Breast cancer Mother   . Heart disease Mother        cardiomyopathy  . Cataracts Mother   . Heart disease Father        CHF  . Cataracts Father     Allergies  Allergen Reactions  . Augmentin [Amoxicillin-Pot Clavulanate]     ? reaction  . Cephalexin Nausea And Vomiting  . Erythromycin Nausea And Vomiting  . Lisinopril Cough  . Losartan     Pain in extremities, paresthesias     Current Outpatient Prescriptions on File Prior to Visit  Medication Sig Dispense Refill  . buPROPion (WELLBUTRIN SR) 150 MG 12 hr tablet take 1 tablet by mouth once daily 30 tablet 5  . cholecalciferol (VITAMIN D) 1000 UNITS tablet Take 1,000 Units by mouth daily.    . clonazePAM (KLONOPIN) 1 MG tablet take 1 tablet by mouth at bedtime if needed 30 tablet 2  . estradiol (ESTRACE) 0.5 MG tablet  take 1 tablet by mouth once daily 30 tablet 5  . omeprazole (PRILOSEC) 40 MG capsule take 1 capsule by mouth twice a day for 2 weeks THEN ONCE DAILY 90 capsule 0  . omeprazole (PRILOSEC) 40 MG capsule take 1 capsule by mouth twice a day for 2 weeks then once daily 90 capsule 0  . omeprazole (PRILOSEC) 40 MG capsule take 1 capsule by mouth twice a day for 2 weeks then once daily 90 capsule 2  . valsartan-hydrochlorothiazide (DIOVAN HCT) 160-12.5 MG tablet Take 1 tablet by mouth daily. 30 tablet 1   No current facility-administered medications on file prior to visit.     BP 124/82 (BP Location: Left Arm, Patient Position: Sitting, Cuff Size: Normal)   Pulse 86   Temp 98.1 F (36.7 C) (Oral)   Resp 14   Ht 5\' 4"  (1.626 m)   Wt 208 lb (94.3 kg)   SpO2 100%   BMI  35.70 kg/m       Objective:   Physical Exam  General  Mental Status - Alert. General Appearance - Well groomed. Not in acute distress. But coughs almost entire time.  Skin Rashes- No Rashes.  HEENT Head- Normal. Ear Auditory Canal - Left- Normal. Right - Normal.Tympanic Membrane- Left- Normal. Right- Normal. Eye Sclera/Conjunctiva- Left- Normal. Right- Normal. Nose & Sinuses Nasal Mucosa- Left-   Not Boggy and Congested. Right-  Not  Boggy and  Congested.Bilateral no  maxillary and no  frontal sinus pressure. Mouth & Throat Lips: Upper Lip- Normal: no dryness, cracking, pallor, cyanosis, or vesicular eruption. Lower Lip-Normal: no dryness, cracking, pallor, cyanosis or vesicular eruption. Buccal Mucosa- Bilateral- No Aphthous ulcers. Oropharynx- No Discharge or Erythema. Mild pnd Tonsils: Characteristics- Bilateral- No Erythema or Congestion. Size/Enlargement- Bilateral- No enlargement. Discharge- bilateral-None.  Neck Neck- Supple. No Masses.   Chest and Lung Exam Auscultation: Breath Sounds:-Clear even and unlabored.  Cardiovascular Auscultation:Rythm- Regular, rate and rhythm. Murmurs & Other Heart Sounds:Ausculatation of the heart reveal- No Murmurs.  Lymphatic Head & Neck General Head & Neck Lymphatics: Bilateral: Description- No Localized lymphadenopathy.  Lower ext- no pedal edema. Negative homans signs.       Assessment & Plan:  You appear to have bronchitis. Rest hydrate and tylenol for fever. I am prescribing cough medicine hydocan, and azithromycin antibiotic.(considere pertussis or pneumonia in differential)  For wheezing or severe cough can try your inhalers. I am providing tapered prednisone to use over weekend in event wheezing while on vacation.  Please get cxr today  Follow up in 7-10 days or as needed  Grace Vaughn, Grace Vaughn, Grace Vaughn

## 2016-12-06 ENCOUNTER — Other Ambulatory Visit: Payer: Self-pay | Admitting: Family Medicine

## 2016-12-07 MED ORDER — CLONAZEPAM 1 MG PO TABS
ORAL_TABLET | ORAL | 2 refills | Status: DC
Start: 2016-12-07 — End: 2017-03-15

## 2016-12-07 NOTE — Telephone Encounter (Signed)
Received refill request for Klonopin 1mg  (take 1 tab po qhs prn) #30, 2RF  Last RF: 11/08/2016 Last OV: 07/20/2016 Next OV: 12/16/2016 UDS: None  Forwarded to Provider for review, approval or denial

## 2016-12-07 NOTE — Addendum Note (Signed)
Addended by: Sharen Counter D on: 12/07/2016 10:12 AM   Modules accepted: Orders

## 2016-12-08 ENCOUNTER — Encounter: Payer: Self-pay | Admitting: Family Medicine

## 2016-12-08 NOTE — Telephone Encounter (Signed)
rx faxed to [facility]

## 2016-12-13 DIAGNOSIS — G4733 Obstructive sleep apnea (adult) (pediatric): Secondary | ICD-10-CM | POA: Diagnosis not present

## 2016-12-16 ENCOUNTER — Ambulatory Visit (INDEPENDENT_AMBULATORY_CARE_PROVIDER_SITE_OTHER): Payer: 59 | Admitting: Family Medicine

## 2016-12-16 VITALS — BP 126/69 | HR 78

## 2016-12-16 DIAGNOSIS — E784 Other hyperlipidemia: Secondary | ICD-10-CM | POA: Diagnosis not present

## 2016-12-16 DIAGNOSIS — I10 Essential (primary) hypertension: Secondary | ICD-10-CM

## 2016-12-16 DIAGNOSIS — E7849 Other hyperlipidemia: Secondary | ICD-10-CM

## 2016-12-16 LAB — LIPID PANEL
Cholesterol: 200 mg/dL — ABNORMAL HIGH (ref <200)
HDL: 75 mg/dL (ref >50)
LDL Cholesterol: 98 mg/dL (ref <100)
Total CHOL/HDL Ratio: 2.7 Ratio (ref <5.0)
Triglycerides: 136 mg/dL (ref <150)
VLDL: 27 mg/dL (ref <30)

## 2016-12-16 NOTE — Progress Notes (Signed)
Pre visit review using our clinic tool,if applicable. No additional management support is needed unless otherwise documented below in the visit note.   Patient in for BP check per order from Dr. Carollee Herter dated 12/01/16.  Patient states she sent Email regarding lab-work but never got response. Per Dr. Carollee Herter on May 24th patient to have Lipid panel and CMP. Order placed in system patient states she is fasting this am.  Patients BP today = 126/69 P=78  Per Dr. Carollee Herter patient to return in August for follow up, continue taking current medications as ordered. Ok to have labs drawn today. CMP and Lipid panel.   Patient will call back and schedule August appointment after she checks her schedule.

## 2016-12-16 NOTE — Progress Notes (Signed)
Grace Vaughn R Lowne Chase, DO 

## 2016-12-19 ENCOUNTER — Encounter: Payer: Self-pay | Admitting: Medical

## 2016-12-19 ENCOUNTER — Encounter: Payer: Self-pay | Admitting: Family Medicine

## 2016-12-20 ENCOUNTER — Telehealth: Payer: Self-pay | Admitting: Medical

## 2016-12-20 MED ORDER — ATORVASTATIN CALCIUM 10 MG PO TABS: 10.0000 mg | ORAL_TABLET | Freq: Every day | ORAL | 3 refills | Status: DC

## 2016-12-20 NOTE — Telephone Encounter (Signed)
cpe ---- complete physical exam

## 2016-12-20 NOTE — Telephone Encounter (Signed)
Will you let pt know that in light of her mild lipid panel elevation and xray findings we will rx low dose statin antibiotic atorvastatin.   I think this is good idea and Dr. Etter Sjogren agrees. I sent rx to her pharmacy. Continue low cholesterol diet. Repeat lipid panel in 3 months fasting.

## 2016-12-20 NOTE — Telephone Encounter (Signed)
cpe-- complete physical exam What are the ? About cxr----

## 2016-12-20 NOTE — Telephone Encounter (Signed)
cpe-- complete physical exam What ? Was there about the cxr--- -we see aortic arth on a lot of people esp as they get older---- aspirin a day and controlling cholesterol is important

## 2016-12-20 NOTE — Telephone Encounter (Signed)
Dr. Etter Sjogren,  Pt saw recent xray report that mentioned  There is calcification in the wall of the aortic arch.  Looks like you did lipid panel just recently and number looked good except mild total cholesterol.( But better than on last check) She sent me a my chart message wanting to know if she should see cardiologist or other therapy. Wanted to get your opinion as she is your patient. Sometimes under scenario like this I will give very low dose statin. Do you agree. Or do you want me to advise stay current course.  Thanks, Architect, Percell Miller, PA-C

## 2016-12-20 NOTE — Telephone Encounter (Signed)
I agree with statin

## 2016-12-21 NOTE — Telephone Encounter (Signed)
Left pt a message to call back. 

## 2016-12-27 ENCOUNTER — Other Ambulatory Visit: Payer: Self-pay | Admitting: Family Medicine

## 2016-12-27 DIAGNOSIS — L821 Other seborrheic keratosis: Secondary | ICD-10-CM | POA: Diagnosis not present

## 2016-12-27 DIAGNOSIS — Z85828 Personal history of other malignant neoplasm of skin: Secondary | ICD-10-CM | POA: Diagnosis not present

## 2016-12-27 DIAGNOSIS — Z1283 Encounter for screening for malignant neoplasm of skin: Secondary | ICD-10-CM | POA: Diagnosis not present

## 2017-01-03 ENCOUNTER — Ambulatory Visit (INDEPENDENT_AMBULATORY_CARE_PROVIDER_SITE_OTHER): Payer: 59 | Admitting: Family Medicine

## 2017-01-03 ENCOUNTER — Encounter: Payer: Self-pay | Admitting: Family Medicine

## 2017-01-03 VITALS — BP 136/80 | HR 88 | Temp 98.4°F | Resp 16 | Ht 64.0 in | Wt 204.4 lb

## 2017-01-03 DIAGNOSIS — I10 Essential (primary) hypertension: Secondary | ICD-10-CM

## 2017-01-03 DIAGNOSIS — E785 Hyperlipidemia, unspecified: Secondary | ICD-10-CM | POA: Insufficient documentation

## 2017-01-03 MED ORDER — HYDROCHLOROTHIAZIDE 25 MG PO TABS: 25.0000 mg | ORAL_TABLET | Freq: Every day | ORAL | 1 refills | Status: DC

## 2017-01-03 MED ORDER — METOPROLOL SUCCINATE ER 50 MG PO TB24: 50.0000 mg | ORAL_TABLET | Freq: Every day | ORAL | 2 refills | Status: DC

## 2017-01-03 NOTE — Progress Notes (Signed)
Patient ID: Grace Vaughn, female   DOB: February 03, 1956, 61 y.o.   MRN: [id number]     Subjective:  I acted as a Education administrator for Dr. Carollee Herter.  Grace Vaughn, Grace Vaughn   Patient ID: Grace Vaughn, female    DOB: [birth year], 61 y.o.   MRN: [id number]  No chief complaint on file.   HPI  Patient is in today to discuss Diovan/HCTZ.  She feels that it is causing her to have a "smokers voice."  Noticed the voice change about a month ago.    Patient Care Team: Carollee Herter, Alferd Apa, DO as PCP - General (Family Medicine) Hale Bogus., MD as Referring Physician (Gastroenterology)   Past Medical History:  Diagnosis Date  . Anxiety   . Colon polyps   . GERD (gastroesophageal reflux disease)   . Hypertension   . Insomnia   . Vertigo     Past Surgical History:  Procedure Laterality Date  . ABDOMINAL HYSTERECTOMY  01/1999  . [place]  . CHOLECYSTECTOMY    . ROTATOR CUFF REPAIR Right     Family History  Problem Relation Age of Onset  . Breast cancer Mother   . Heart disease Mother        cardiomyopathy  . Cataracts Mother   . Heart disease Father        CHF  . Cataracts Father     Social History   Social History  . Marital status: Married    Spouse name: N/A  . Number of children: 2  . Years of education: BA   Occupational History  .      [employer]   Social History Main Topics  . Smoking status: Never Smoker  . Smokeless tobacco: Never Used  . Alcohol use Yes     Comment: rare  . Drug use: No  . Sexual activity: Yes   Other Topics Concern  . Not on file   Social History Narrative   Exercise--- walking 10,000 steps daily   Drinks 1-2 caffeine drinks a day     Outpatient Medications Prior to Visit  Medication Sig Dispense Refill  . buPROPion (WELLBUTRIN SR) 150 MG 12 hr tablet take 1 tablet by mouth once daily 30 tablet 5  . cholecalciferol (VITAMIN D) 1000 UNITS tablet Take 1,000 Units by mouth daily.    . clonazePAM (KLONOPIN) 1  MG tablet take 1 tablet by mouth at bedtime if needed 30 tablet 2  . estradiol (ESTRACE) 0.5 MG tablet take 1 tablet by mouth once daily 30 tablet 5  . omeprazole (PRILOSEC) 40 MG capsule take 1 capsule by mouth twice a day for 2 weeks THEN ONCE DAILY 90 capsule 0  . valsartan-hydrochlorothiazide (DIOVAN HCT) 160-12.5 MG tablet Take 1 tablet by mouth daily. 30 tablet 1  . atorvastatin (LIPITOR) 10 MG tablet Take 1 tablet (10 mg total) by mouth daily. (Patient not taking: Reported on 01/03/2017) 30 tablet 3  . azithromycin (ZITHROMAX) 250 MG tablet Take 2 tablets by mouth on day 1, followed by 1 tablet by mouth daily for 4 days. 6 tablet 0  . BENICAR HCT 40-25 MG tablet take 1 tablet by mouth once daily 30 tablet 0  . HYDROcodone-homatropine (HYCODAN) 5-1.5 MG/5ML syrup Take 5 mLs by mouth every 8 (eight) hours as needed for cough. 120 mL 0  . omeprazole (PRILOSEC) 40 MG capsule take 1 capsule by mouth twice a day for 2 weeks then once daily 90 capsule 0  .  omeprazole (PRILOSEC) 40 MG capsule take 1 capsule by mouth twice a day for 2 weeks then once daily 90 capsule 2  . predniSONE (DELTASONE) 10 MG tablet 5 TAB PO DAY 1 4 TAB PO DAY 2 3 TAB PO DAY 3 2 TAB PO DAY 4 1 TAB PO DAY 5 15 tablet 0   No facility-administered medications prior to visit.     Allergies  Allergen Reactions  . Augmentin [Amoxicillin-Pot Clavulanate]     ? reaction  . Cephalexin Nausea And Vomiting  . Diovan [Valsartan]   . Erythromycin Nausea And Vomiting  . Lisinopril Cough  . Losartan     Pain in extremities, paresthesias     Review of Systems  Constitutional: Negative for fever and malaise/fatigue.  HENT: Negative for congestion.   Eyes: Negative for blurred vision.  Respiratory: Negative for cough and shortness of breath.   Cardiovascular: Negative for chest pain, palpitations and leg swelling.  Gastrointestinal: Negative for vomiting.  Musculoskeletal: Negative for back pain.  Skin: Negative for rash.    Neurological: Negative for loss of consciousness and headaches.       Objective:    Physical Exam  BP 136/80 (BP Location: Left Arm, Cuff Size: Large)   Pulse 88   Temp 98.4 F (36.9 C) (Oral)   Resp 16   Ht 5\' 4"  (1.626 m)   Wt 204 lb 6.4 oz (92.7 kg)   SpO2 97%   BMI 35.09 kg/m  Wt Readings from Last 3 Encounters:  01/03/17 204 lb 6.4 oz (92.7 kg)  12/01/16 208 lb (94.3 kg)  07/20/16 206 lb 12.8 oz (93.8 kg)   BP Readings from Last 3 Encounters:  01/03/17 136/80  12/16/16 126/69  12/01/16 124/82     Immunization History  Administered Date(s) Administered  . Influenza,inj,Quad PF,36+ Mos 05/06/2015  . Influenza-Unspecified 03/19/2016  . Tdap 05/06/2015    Health Maintenance  Topic Date Due  . INFLUENZA VACCINE  02/16/2017  . PAP SMEAR  05/01/2017  . MAMMOGRAM  07/20/2018  . COLONOSCOPY  07/18/2020  . TETANUS/TDAP  05/05/2025  . Hepatitis C Screening  Completed  . HIV Screening  Completed    Lab Results  Component Value Date   WBC 9.2 06/08/2016   HGB 13.2 06/08/2016   HCT 39.3 06/08/2016   PLT 365.0 06/08/2016   GLUCOSE 103 (H) 06/08/2016   CHOL 200 (H) 12/16/2016   TRIG 136 12/16/2016   HDL 75 12/16/2016   LDLCALC 98 12/16/2016   ALT 14 06/08/2016   AST 17 06/08/2016   NA 137 06/08/2016   K 3.4 (L) 06/08/2016   CL 102 06/08/2016   CREATININE 0.79 06/08/2016   BUN 22 06/08/2016   CO2 24 06/08/2016   TSH 3.86 06/08/2016    Lab Results  Component Value Date   TSH 3.86 06/08/2016   Lab Results  Component Value Date   WBC 9.2 06/08/2016   HGB 13.2 06/08/2016   HCT 39.3 06/08/2016   MCV 87.3 06/08/2016   PLT 365.0 06/08/2016   Lab Results  Component Value Date   NA 137 06/08/2016   K 3.4 (L) 06/08/2016   CO2 24 06/08/2016   GLUCOSE 103 (H) 06/08/2016   BUN 22 06/08/2016   CREATININE 0.79 06/08/2016   BILITOT 0.2 06/08/2016   ALKPHOS 81 06/08/2016   AST 17 06/08/2016   ALT 14 06/08/2016   PROT 7.3 06/08/2016   ALBUMIN 4.2  06/08/2016   CALCIUM 9.4 06/08/2016   GFR 78.98  06/08/2016   Lab Results  Component Value Date   CHOL 200 (H) 12/16/2016   Lab Results  Component Value Date   HDL 75 12/16/2016   Lab Results  Component Value Date   LDLCALC 98 12/16/2016   Lab Results  Component Value Date   TRIG 136 12/16/2016   Lab Results  Component Value Date   CHOLHDL 2.7 12/16/2016   No results found for: HGBA1C       Assessment & Plan:   Problem List Items Addressed This Visit      Unprioritized   Essential hypertension - Primary    Well controlled, no changes to meds. Encouraged heart healthy diet such as the DASH diet and exercise as tolerated.       Relevant Medications   aspirin 81 MG tablet   metoprolol succinate (TOPROL XL) 50 MG 24 hr tablet   hydrochlorothiazide (HYDRODIURIL) 25 MG tablet   Other Relevant Orders   Lipid panel   Comprehensive metabolic panel   Hyperlipidemia    Check labs Encouraged heart healthy diet, increase exercise, avoid trans fats, consider a krill oil cap daily      Relevant Medications   aspirin 81 MG tablet   metoprolol succinate (TOPROL XL) 50 MG 24 hr tablet   hydrochlorothiazide (HYDRODIURIL) 25 MG tablet   Other Relevant Orders   Lipid panel   Comprehensive metabolic panel      I have discontinued Ms. Orrico's valsartan-hydrochlorothiazide, HYDROcodone-homatropine, azithromycin, predniSONE, atorvastatin, and BENICAR HCT. I am also having her start on metoprolol succinate and hydrochlorothiazide. Additionally, I am having her maintain her cholecalciferol, omeprazole, estradiol, buPROPion, clonazePAM, aspirin, and KRILL OIL PO.  Meds ordered this encounter  Medications  . aspirin 81 MG tablet    Sig: Take 81 mg by mouth daily.  Marland Kitchen KRILL OIL PO    Sig: Take 1 capsule by mouth daily.  . metoprolol succinate (TOPROL XL) 50 MG 24 hr tablet    Sig: Take 1 tablet (50 mg total) by mouth daily. Take with or immediately following a meal.     Dispense:  30 tablet    Refill:  2  . hydrochlorothiazide (HYDRODIURIL) 25 MG tablet    Sig: Take 1 tablet (25 mg total) by mouth daily.    Dispense:  90 tablet    Refill:  1    CMA served as Education administrator during this visit. History, Physical and Plan performed by medical provider. Documentation and orders reviewed and attested to.  Ann Held, DO

## 2017-01-03 NOTE — Assessment & Plan Note (Signed)
Well controlled, no changes to meds. Encouraged heart healthy diet such as the DASH diet and exercise as tolerated.  °

## 2017-01-03 NOTE — Assessment & Plan Note (Signed)
Check labs  Encouraged heart healthy diet, increase exercise, avoid trans fats, consider a krill oil cap daily 

## 2017-01-03 NOTE — Patient Instructions (Signed)

## 2017-01-07 NOTE — Telephone Encounter (Signed)
Left a pt a message notifying her of message below informed her to call if she has any questions.

## 2017-01-13 DIAGNOSIS — G4733 Obstructive sleep apnea (adult) (pediatric): Secondary | ICD-10-CM | POA: Diagnosis not present

## 2017-01-25 ENCOUNTER — Encounter: Payer: Self-pay | Admitting: Family Medicine

## 2017-01-25 ENCOUNTER — Ambulatory Visit (INDEPENDENT_AMBULATORY_CARE_PROVIDER_SITE_OTHER): Payer: 59 | Admitting: Family Medicine

## 2017-01-25 ENCOUNTER — Other Ambulatory Visit (INDEPENDENT_AMBULATORY_CARE_PROVIDER_SITE_OTHER): Payer: 59

## 2017-01-25 VITALS — BP 122/80 | HR 68 | Temp 98.1°F | Resp 16 | Ht 64.0 in | Wt 198.0 lb

## 2017-01-25 DIAGNOSIS — E785 Hyperlipidemia, unspecified: Secondary | ICD-10-CM | POA: Diagnosis not present

## 2017-01-25 DIAGNOSIS — I10 Essential (primary) hypertension: Secondary | ICD-10-CM

## 2017-01-25 LAB — LIPID PANEL
CHOLESTEROL: 175 mg/dL (ref 0–200)
HDL: 60.2 mg/dL (ref >39.00)
LDL CALC: 95 mg/dL (ref 0–99)
NonHDL: 114.56
Total CHOL/HDL Ratio: 3
Triglycerides: 96 mg/dL (ref 0.0–149.0)
VLDL: 19.2 mg/dL (ref 0.0–40.0)

## 2017-01-25 LAB — COMPREHENSIVE METABOLIC PANEL
ALBUMIN: 4.2 g/dL (ref 3.5–5.2)
ALK PHOS: 59 U/L (ref 39–117)
ALT: 20 U/L (ref 0–35)
AST: 23 U/L (ref 0–37)
BUN: 21 mg/dL (ref 6–23)
CHLORIDE: 99 meq/L (ref 96–112)
CO2: 26 mEq/L (ref 19–32)
Calcium: 9.7 mg/dL (ref 8.4–10.5)
Creatinine, Ser: 0.8 mg/dL (ref 0.40–1.20)
GFR: 77.67 mL/min (ref >60.00)
Glucose, Bld: 104 mg/dL — ABNORMAL HIGH (ref 70–99)
POTASSIUM: 2.9 meq/L — AB (ref 3.5–5.1)
SODIUM: 137 meq/L (ref 135–145)
TOTAL PROTEIN: 7.3 g/dL (ref 6.0–8.3)
Total Bilirubin: 0.4 mg/dL (ref 0.2–1.2)

## 2017-01-25 NOTE — Patient Instructions (Signed)

## 2017-01-25 NOTE — Progress Notes (Signed)
Patient ID: Grace Vaughn, female   DOB: 02-Dec-1955, 61 y.o.   MRN: [id number]     Subjective:  I acted as a Education administrator for Dr. Carollee Herter.  Grace Vaughn, Grace Vaughn   Patient ID: Grace Vaughn, female    DOB: [birth year], 61 y.o.   MRN: [id number]  Chief Complaint  Patient presents with  . Hypertension    HPI  Patient is in today for follow up blood pressure.  She states that her voice is much better with new med.  No comlaints.   Patient Care Team: Carollee Herter, Alferd Apa, DO as PCP - General (Family Medicine) Hale Bogus., MD as Referring Physician (Gastroenterology)   Past Medical History:  Diagnosis Date  . Anxiety   . Colon polyps   . GERD (gastroesophageal reflux disease)   . Hypertension   . Insomnia   . Vertigo     Past Surgical History:  Procedure Laterality Date  . ABDOMINAL HYSTERECTOMY  01/1999  . [place]  . CHOLECYSTECTOMY    . ROTATOR CUFF REPAIR Right     Family History  Problem Relation Age of Onset  . Breast cancer Mother   . Heart disease Mother        cardiomyopathy  . Cataracts Mother   . Heart disease Father        CHF  . Cataracts Father     Social History   Social History  . Marital status: Married    Spouse name: N/A  . Number of children: 2  . Years of education: BA   Occupational History  .      [employer]   Social History Main Topics  . Smoking status: Never Smoker  . Smokeless tobacco: Never Used  . Alcohol use Yes     Comment: rare  . Drug use: No  . Sexual activity: Yes   Other Topics Concern  . Not on file   Social History Narrative   Exercise--- walking 10,000 steps daily   Drinks 1-2 caffeine drinks a day     Outpatient Medications Prior to Visit  Medication Sig Dispense Refill  . aspirin 81 MG tablet Take 81 mg by mouth daily.    Marland Kitchen buPROPion (WELLBUTRIN SR) 150 MG 12 hr tablet take 1 tablet by mouth once daily 30 tablet 5  . cholecalciferol (VITAMIN D) 1000 UNITS tablet Take  1,000 Units by mouth daily.    . clonazePAM (KLONOPIN) 1 MG tablet take 1 tablet by mouth at bedtime if needed 30 tablet 2  . estradiol (ESTRACE) 0.5 MG tablet take 1 tablet by mouth once daily 30 tablet 5  . hydrochlorothiazide (HYDRODIURIL) 25 MG tablet Take 1 tablet (25 mg total) by mouth daily. 90 tablet 1  . KRILL OIL PO Take 1 capsule by mouth daily.    . metoprolol succinate (TOPROL XL) 50 MG 24 hr tablet Take 1 tablet (50 mg total) by mouth daily. Take with or immediately following a meal. 30 tablet 2  . omeprazole (PRILOSEC) 40 MG capsule take 1 capsule by mouth twice a day for 2 weeks THEN ONCE DAILY 90 capsule 0   No facility-administered medications prior to visit.     Allergies  Allergen Reactions  . Augmentin [Amoxicillin-Pot Clavulanate]     ? reaction  . Cephalexin Nausea And Vomiting  . Diovan [Valsartan]   . Erythromycin Nausea And Vomiting  . Lisinopril Cough  . Losartan     Pain in extremities, paresthesias  Review of Systems  Constitutional: Negative for fever and malaise/fatigue.  HENT: Negative for congestion.   Eyes: Negative for blurred vision.  Respiratory: Negative for cough and shortness of breath.   Cardiovascular: Negative for chest pain, palpitations and leg swelling.  Gastrointestinal: Negative for vomiting.  Musculoskeletal: Negative for back pain.  Skin: Negative for rash.  Neurological: Negative for loss of consciousness and headaches.       Objective:    Physical Exam  Constitutional: She is oriented to person, place, and time. She appears well-developed and well-nourished. No distress.  HENT:  Head: Normocephalic and atraumatic.  Eyes: Conjunctivae are normal.  Neck: Normal range of motion. No thyromegaly present.  Cardiovascular: Normal rate and regular rhythm.   Pulmonary/Chest: Effort normal and breath sounds normal. She has no wheezes.  Abdominal: Soft. Bowel sounds are normal. There is no tenderness.  Musculoskeletal: Normal  range of motion. She exhibits no edema or deformity.  Neurological: She is alert and oriented to person, place, and time.  Skin: Skin is warm and dry. She is not diaphoretic.  Psychiatric: She has a normal mood and affect.    BP 122/80 (BP Location: Left Arm, Cuff Size: Normal)   Pulse 68   Temp 98.1 F (36.7 C) (Oral)   Resp 16   Ht 5\' 4"  (1.626 m)   Wt 198 lb (89.8 kg)   SpO2 96%   BMI 33.99 kg/m  Wt Readings from Last 3 Encounters:  01/25/17 198 lb (89.8 kg)  01/03/17 204 lb 6.4 oz (92.7 kg)  12/01/16 208 lb (94.3 kg)   BP Readings from Last 3 Encounters:  01/25/17 122/80  01/03/17 136/80  12/16/16 126/69     Immunization History  Administered Date(s) Administered  . Influenza,inj,Quad PF,36+ Mos 05/06/2015  . Influenza-Unspecified 03/19/2016  . Tdap 05/06/2015    Health Maintenance  Topic Date Due  . INFLUENZA VACCINE  02/16/2017  . PAP SMEAR  05/01/2017  . MAMMOGRAM  07/20/2018  . COLONOSCOPY  07/18/2020  . TETANUS/TDAP  05/05/2025  . Hepatitis C Screening  Completed  . HIV Screening  Completed    Lab Results  Component Value Date   WBC 9.2 06/08/2016   HGB 13.2 06/08/2016   HCT 39.3 06/08/2016   PLT 365.0 06/08/2016   GLUCOSE 104 (H) 01/25/2017   CHOL 175 01/25/2017   TRIG 96.0 01/25/2017   HDL 60.20 01/25/2017   LDLCALC 95 01/25/2017   ALT 20 01/25/2017   AST 23 01/25/2017   NA 137 01/25/2017   K 2.9 (L) 01/25/2017   CL 99 01/25/2017   CREATININE 0.80 01/25/2017   BUN 21 01/25/2017   CO2 26 01/25/2017   TSH 3.86 06/08/2016    Lab Results  Component Value Date   TSH 3.86 06/08/2016   Lab Results  Component Value Date   WBC 9.2 06/08/2016   HGB 13.2 06/08/2016   HCT 39.3 06/08/2016   MCV 87.3 06/08/2016   PLT 365.0 06/08/2016   Lab Results  Component Value Date   NA 137 01/25/2017   K 2.9 (L) 01/25/2017   CO2 26 01/25/2017   GLUCOSE 104 (H) 01/25/2017   BUN 21 01/25/2017   CREATININE 0.80 01/25/2017   BILITOT 0.4 01/25/2017    ALKPHOS 59 01/25/2017   AST 23 01/25/2017   ALT 20 01/25/2017   PROT 7.3 01/25/2017   ALBUMIN 4.2 01/25/2017   CALCIUM 9.7 01/25/2017   GFR 77.67 01/25/2017   Lab Results  Component Value Date  CHOL 175 01/25/2017   Lab Results  Component Value Date   HDL 60.20 01/25/2017   Lab Results  Component Value Date   LDLCALC 95 01/25/2017   Lab Results  Component Value Date   TRIG 96.0 01/25/2017   Lab Results  Component Value Date   CHOLHDL 3 01/25/2017   No results found for: HGBA1C       Assessment & Plan:   Problem List Items Addressed This Visit      Unprioritized   Essential hypertension - Primary    Well controlled, no changes to meds. Encouraged heart healthy diet such as the DASH diet and exercise as tolerated.   I am having Ms. Harbour maintain her cholecalciferol, omeprazole, estradiol, buPROPion, clonazePAM, aspirin, KRILL OIL PO, metoprolol succinate, and hydrochlorothiazide.  No orders of the defined types were placed in this encounter.   CMA served as Education administrator during this visit. History, Physical and Plan performed by medical provider. Documentation and orders reviewed and attested to.  Ann Held, DO

## 2017-01-25 NOTE — Assessment & Plan Note (Signed)
Well controlled, no changes to meds. Encouraged heart healthy diet such as the DASH diet and exercise as tolerated.  °

## 2017-01-25 NOTE — Assessment & Plan Note (Signed)
Encouraged heart healthy diet, increase exercise, avoid trans fats, consider a krill oil cap daily 

## 2017-01-27 ENCOUNTER — Encounter: Payer: Self-pay | Admitting: Family Medicine

## 2017-01-28 ENCOUNTER — Other Ambulatory Visit: Payer: Self-pay | Admitting: Family Medicine

## 2017-01-28 DIAGNOSIS — E876 Hypokalemia: Secondary | ICD-10-CM

## 2017-01-28 MED ORDER — POTASSIUM CHLORIDE CRYS ER 20 MEQ PO TBCR: 20.0000 meq | EXTENDED_RELEASE_TABLET | Freq: Every day | ORAL | 3 refills | Status: DC

## 2017-01-28 NOTE — Telephone Encounter (Signed)
As soon as I look at them they will be released

## 2017-01-31 DIAGNOSIS — G4733 Obstructive sleep apnea (adult) (pediatric): Secondary | ICD-10-CM | POA: Diagnosis not present

## 2017-02-12 DIAGNOSIS — G4733 Obstructive sleep apnea (adult) (pediatric): Secondary | ICD-10-CM | POA: Diagnosis not present

## 2017-02-14 ENCOUNTER — Other Ambulatory Visit (INDEPENDENT_AMBULATORY_CARE_PROVIDER_SITE_OTHER): Payer: 59

## 2017-02-14 DIAGNOSIS — E876 Hypokalemia: Secondary | ICD-10-CM

## 2017-02-14 LAB — BASIC METABOLIC PANEL
BUN: 25 mg/dL — ABNORMAL HIGH (ref 6–23)
CO2: 28 mEq/L (ref 19–32)
Calcium: 9.4 mg/dL (ref 8.4–10.5)
Chloride: 102 mEq/L (ref 96–112)
Creatinine, Ser: 0.73 mg/dL (ref 0.40–1.20)
GFR: 86.32 mL/min (ref >60.00)
Glucose, Bld: 97 mg/dL (ref 70–99)
Potassium: 3.4 mEq/L — ABNORMAL LOW (ref 3.5–5.1)
Sodium: 138 mEq/L (ref 135–145)

## 2017-02-21 ENCOUNTER — Telehealth: Payer: Self-pay | Admitting: Family Medicine

## 2017-02-21 NOTE — Telephone Encounter (Signed)
Pt says that PCP increased her potassium medication so pt is now out. Pt would like to first know if there is a smaller pill for potassium that she could take? Pt says that the current one that she is taking is hard to swallow.    If no, pt will refill current but if so pt would like to switch.  Pt would like to have sent to pharmacy below.    Pharmacy: [address]    CB: [phone] (work)     OR [phone] (cell)

## 2017-02-21 NOTE — Telephone Encounter (Signed)
OK to change KCL 20 meq tabs, 2 tabs po daily disp #60 and recheck cmp in next week if not already ordered

## 2017-02-22 ENCOUNTER — Other Ambulatory Visit: Payer: Self-pay | Admitting: Family Medicine

## 2017-02-22 DIAGNOSIS — E876 Hypokalemia: Secondary | ICD-10-CM

## 2017-02-22 MED ORDER — POTASSIUM CHLORIDE CRYS ER 20 MEQ PO TBCR: EXTENDED_RELEASE_TABLET | ORAL | 0 refills | Status: DC

## 2017-02-22 NOTE — Telephone Encounter (Signed)
Called the patient informed of change/put  In order for cmp next week/made appt. Sent in medication to pharmacy

## 2017-02-23 NOTE — Telephone Encounter (Signed)
Pt called in to get clarity, she would like to know why do she need to now take 2 tabs?    ALSO, previously pt was requesting a smaller pill for potassium. Pt says that they are hard to swallow.    PCP and signing PCP is out of the office, forwarded message to DOD.

## 2017-02-24 NOTE — Telephone Encounter (Signed)
She only needs one set of labs on the new dose of potassium but unfortunately there are no smaller pills. There is a liquid but it tastes horrible

## 2017-02-25 NOTE — Telephone Encounter (Signed)
She did verbalize understanding and had no further questions/or concerns at this time.

## 2017-02-25 NOTE — Telephone Encounter (Signed)
Patient informed of instructions to take 2 tabs daily. Informed of covering provider instructions regarding size of pills/and liquid. She stated she will do her best to take/swallow them and come back to the lab next Friday to recheck potassium

## 2017-02-25 NOTE — Telephone Encounter (Signed)
Spoke to the patient.

## 2017-02-25 NOTE — Telephone Encounter (Signed)
Called left message to call back 

## 2017-03-01 ENCOUNTER — Ambulatory Visit (INDEPENDENT_AMBULATORY_CARE_PROVIDER_SITE_OTHER): Payer: 59 | Admitting: Family Medicine

## 2017-03-01 ENCOUNTER — Ambulatory Visit (HOSPITAL_BASED_OUTPATIENT_CLINIC_OR_DEPARTMENT_OTHER)
Admission: RE | Admit: 2017-03-01 | Discharge: 2017-03-01 | Disposition: A | Discharge status: Home / Self Care | Payer: 59 | Source: Ambulatory Visit | Attending: Family Medicine | Admitting: Family Medicine

## 2017-03-01 ENCOUNTER — Encounter: Payer: Self-pay | Admitting: Family Medicine

## 2017-03-01 VITALS — BP 121/80 | HR 70 | Ht 64.0 in | Wt 192.8 lb

## 2017-03-01 DIAGNOSIS — S8991XA Unspecified injury of right lower leg, initial encounter: Secondary | ICD-10-CM

## 2017-03-01 DIAGNOSIS — W19XXXA Unspecified fall, initial encounter: Secondary | ICD-10-CM | POA: Diagnosis not present

## 2017-03-01 DIAGNOSIS — M1711 Unilateral primary osteoarthritis, right knee: Secondary | ICD-10-CM | POA: Insufficient documentation

## 2017-03-01 DIAGNOSIS — S8001XA Contusion of right knee, initial encounter: Secondary | ICD-10-CM | POA: Diagnosis not present

## 2017-03-01 NOTE — Patient Instructions (Signed)
You have a knee contusion. Ice the knee 15 minutes at a time 3-4 times a day as needed. Tylenol 500mg  1-2 tabs three times a day as needed for pain. Aleve 2 tabs twice a day as needed for pain and inflammation. Ok to walk for exercise as tolerated. Straight leg raises, knee extensions 3 sets of 10 once a day. Add ankle weights if these become too easy. Expect this to take up to 6 weeks to completely resolve. Follow up with me in 4 weeks if you're struggling.

## 2017-03-02 DIAGNOSIS — S8991XA Unspecified injury of right lower leg, initial encounter: Secondary | ICD-10-CM | POA: Insufficient documentation

## 2017-03-02 NOTE — Progress Notes (Signed)
PCP: Ann Held, DO  Subjective:   HPI: Patient is a 61 y.o. female here for right knee injury.  Patient reports on 7/27 she tripped over a dog at home and fell directly onto right knee. + swelling and bruising anteriorly down into lower leg that has improved. Pain level down to 1/10, feels like a tightness. Taking tylenol which helps some. No history of osteoporosis. Has been icing, elevating also. No numbness.  Past Medical History:  Diagnosis Date  . Anxiety   . Colon polyps   . GERD (gastroesophageal reflux disease)   . Hypertension   . Insomnia   . Vertigo     Current Outpatient Prescriptions on File Prior to Visit  Medication Sig Dispense Refill  . aspirin 81 MG tablet Take 81 mg by mouth daily.    Marland Kitchen buPROPion (WELLBUTRIN SR) 150 MG 12 hr tablet take 1 tablet by mouth once daily 30 tablet 5  . cholecalciferol (VITAMIN D) 1000 UNITS tablet Take 1,000 Units by mouth daily.    . clonazePAM (KLONOPIN) 1 MG tablet take 1 tablet by mouth at bedtime if needed 30 tablet 2  . estradiol (ESTRACE) 0.5 MG tablet take 1 tablet by mouth once daily 30 tablet 5  . hydrochlorothiazide (HYDRODIURIL) 25 MG tablet Take 1 tablet (25 mg total) by mouth daily. 90 tablet 1  . KRILL OIL PO Take 1 capsule by mouth daily.    . metoprolol succinate (TOPROL XL) 50 MG 24 hr tablet Take 1 tablet (50 mg total) by mouth daily. Take with or immediately following a meal. 30 tablet 2  . omeprazole (PRILOSEC) 40 MG capsule take 1 capsule by mouth twice a day for 2 weeks THEN ONCE DAILY 90 capsule 0  . potassium chloride SA (K-DUR,KLOR-CON) 20 MEQ tablet Take 1 tablet (20 mEq total) by mouth daily. 30 tablet 3  . potassium chloride SA (K-DUR,KLOR-CON) 20 MEQ tablet Take 2 tablets twice daily (Patient taking differently: Take 2 tablets  daily) 60 tablet 0   No current facility-administered medications on file prior to visit.     Past Surgical History:  Procedure Laterality Date  . ABDOMINAL  HYSTERECTOMY  01/1999  . [place]  . CHOLECYSTECTOMY    . ROTATOR CUFF REPAIR Right     Allergies  Allergen Reactions  . Augmentin [Amoxicillin-Pot Clavulanate]     ? reaction  . Cephalexin Nausea And Vomiting  . Diovan [Valsartan]   . Erythromycin Nausea And Vomiting  . Lisinopril Cough  . Losartan     Pain in extremities, paresthesias     Social History   Social History  . Marital status: Married    Spouse name: N/A  . Number of children: 2  . Years of education: BA   Occupational History  .      [employer]   Social History Main Topics  . Smoking status: Never Smoker  . Smokeless tobacco: Never Used  . Alcohol use Yes     Comment: rare  . Drug use: No  . Sexual activity: Yes   Other Topics Concern  . Not on file   Social History Narrative   Exercise--- walking 10,000 steps daily   Drinks 1-2 caffeine drinks a day     Family History  Problem Relation Age of Onset  . Breast cancer Mother   . Heart disease Mother        cardiomyopathy  . Cataracts Mother   . Heart disease  Father        CHF  . Cataracts Father     BP 121/80   Pulse 70   Ht 5\' 4"  (1.626 m)   Wt 192 lb 12.8 oz (87.5 kg)   BMI 33.09 kg/m   Review of Systems: See HPI above.     Objective:  Physical Exam:  Gen: NAD, comfortable in exam room  Right knee: No gross deformity, ecchymoses, effusion.  Small mostly healed abrasion over tibial tubercle. Mild TTP medial joint line and medial post patellar facet. FROM. Negative ant/post drawers. Negative valgus/varus testing. Negative lachmanns. Negative mcmurrays, apleys, patellar apprehension. NV intact distally.  Left knee: FROM without pain.   Assessment & Plan:  1. Right knee injury - 2/2 fall.  Independently reviewed radiographs and no evidence tibial plateau or other fracture.  2/2 contusion.  Icing, tylenol, aleve.  Activities as tolerated.  F/u 4 weeks if struggling.

## 2017-03-02 NOTE — Assessment & Plan Note (Signed)
2/2 fall.  Independently reviewed radiographs and no evidence tibial plateau or other fracture.  2/2 contusion.  Icing, tylenol, aleve.  Activities as tolerated.  F/u 4 weeks if struggling.

## 2017-03-04 ENCOUNTER — Other Ambulatory Visit: Payer: 59

## 2017-03-08 ENCOUNTER — Other Ambulatory Visit (INDEPENDENT_AMBULATORY_CARE_PROVIDER_SITE_OTHER): Payer: 59

## 2017-03-08 DIAGNOSIS — E876 Hypokalemia: Secondary | ICD-10-CM | POA: Diagnosis not present

## 2017-03-08 LAB — COMPREHENSIVE METABOLIC PANEL
ALBUMIN: 3.8 g/dL (ref 3.5–5.2)
ALT: 19 U/L (ref 0–35)
AST: 18 U/L (ref 0–37)
Alkaline Phosphatase: 52 U/L (ref 39–117)
BUN: 27 mg/dL — AB (ref 6–23)
CHLORIDE: 102 meq/L (ref 96–112)
CO2: 30 meq/L (ref 19–32)
Calcium: 9.4 mg/dL (ref 8.4–10.5)
Creatinine, Ser: 0.79 mg/dL (ref 0.40–1.20)
GFR: 78.78 mL/min (ref >60.00)
GLUCOSE: 96 mg/dL (ref 70–99)
POTASSIUM: 3.6 meq/L (ref 3.5–5.1)
SODIUM: 138 meq/L (ref 135–145)
Total Bilirubin: 0.5 mg/dL (ref 0.2–1.2)
Total Protein: 7.1 g/dL (ref 6.0–8.3)

## 2017-03-13 ENCOUNTER — Other Ambulatory Visit: Payer: Self-pay | Admitting: Family Medicine

## 2017-03-14 ENCOUNTER — Telehealth: Payer: Self-pay | Admitting: Family Medicine

## 2017-03-15 ENCOUNTER — Other Ambulatory Visit: Payer: Self-pay | Admitting: Family Medicine

## 2017-03-15 DIAGNOSIS — G4733 Obstructive sleep apnea (adult) (pediatric): Secondary | ICD-10-CM | POA: Diagnosis not present

## 2017-03-16 NOTE — Telephone Encounter (Signed)
Rx phoned in.   

## 2017-03-16 NOTE — Telephone Encounter (Signed)
Zantac/ omeprazole have nothing to do with bp She really needs to call nephrology about bp med

## 2017-03-16 NOTE — Telephone Encounter (Signed)
Pt is requesting refill on clonazepam 1mg .  Last OV: 01/25/2017 Last Fill: 12/07/2016 #30 and 2RF UDS: None seen Contract: None seen Database: 12/10/2016 (in media)   Please advise.

## 2017-03-16 NOTE — Telephone Encounter (Signed)
Disregard last message Ok to refill clonopin

## 2017-03-17 ENCOUNTER — Telehealth: Payer: Self-pay | Admitting: Family Medicine

## 2017-03-17 NOTE — Telephone Encounter (Signed)
That's certainly possible.  After an injury it's not unusual for the knee to intermittently swell because the lining of the joint usually is inflamed.  If she's not having any other issues (pain, giving out, catching, locking) I'd just reassure her and see her back in about 2 weeks as planned.  If she is having any of these issues we could see her back in the office today, tomorrow, or Tuesday to reevaluate depending on her schedule.

## 2017-03-17 NOTE — Telephone Encounter (Signed)
Patient calling with concerns about her R knee. States it is "mushy" like there is fluid build up. She noticed it around Friday of last week.

## 2017-03-17 NOTE — Telephone Encounter (Signed)
Called patient back with advice. She said she will call back to schedule an appointment if she experiences any pain, giving out, catching, or locking today

## 2017-03-30 ENCOUNTER — Other Ambulatory Visit: Payer: Self-pay | Admitting: Family Medicine

## 2017-03-30 DIAGNOSIS — I10 Essential (primary) hypertension: Secondary | ICD-10-CM

## 2017-03-31 NOTE — Telephone Encounter (Addendum)
\ °  Relation to WN:IOEV Call back number: [name], [name] (Phone) [phone] (Fax)      Reason for call:  Patient requesting a refill potassium chloride SA (K-DUR,KLOR-CON) 20 MEQ tablet patient states the direction should be 2 tablets daily, requesting refill completley out, please advise

## 2017-04-01 MED ORDER — POTASSIUM CHLORIDE CRYS ER 20 MEQ PO TBCR: EXTENDED_RELEASE_TABLET | ORAL | 0 refills | Status: DC

## 2017-04-01 NOTE — Telephone Encounter (Signed)
See phone note from 8/6 where it discusses the increase. Medication refills sent in.

## 2017-04-01 NOTE — Telephone Encounter (Signed)
Spoke with pt and made her aware she had no additional questions at this time. Nothing further is needed

## 2017-04-01 NOTE — Addendum Note (Signed)
Addended by: Benson Setting L on: 04/01/2017 08:05 AM   Modules accepted: Orders

## 2017-04-15 DIAGNOSIS — G4733 Obstructive sleep apnea (adult) (pediatric): Secondary | ICD-10-CM | POA: Diagnosis not present

## 2017-04-21 ENCOUNTER — Other Ambulatory Visit: Payer: Self-pay | Admitting: Family Medicine

## 2017-04-22 MED ORDER — CLONAZEPAM 1 MG PO TABS: 1.0000 mg | ORAL_TABLET | Freq: Every evening | ORAL | 0 refills | Status: DC | PRN

## 2017-04-22 NOTE — Telephone Encounter (Signed)
Dr Carollee Herter-- Please advise?  Last RX: 03/16/17, #30 Last OV: 01/25/17 Next OV: 07/25/17 UDS: no previous UDS on file and no contract on file. Please advise?

## 2017-04-25 ENCOUNTER — Telehealth: Payer: Self-pay | Admitting: Family Medicine

## 2017-04-25 ENCOUNTER — Other Ambulatory Visit: Payer: Self-pay | Admitting: Family Medicine

## 2017-04-25 DIAGNOSIS — I10 Essential (primary) hypertension: Secondary | ICD-10-CM

## 2017-04-25 MED ORDER — CLONAZEPAM 1 MG PO TABS: 1.0000 mg | ORAL_TABLET | Freq: Every evening | ORAL | 0 refills | Status: DC | PRN

## 2017-04-25 NOTE — Telephone Encounter (Signed)
Marine printed, clonazepam last filled on 03/16/2017. Rx phoned into prescriber line.

## 2017-04-25 NOTE — Telephone Encounter (Signed)
Caller name: [place]  Relation to pt: self Call back number: [phone] Pharmacy:   Reason for call: Pt states does not have any meds left and pt already verified with pharmacy and they have not received it either, pt mentioned has not slept at all. Pt would like to have it sent to pharmacy. Please advise ASAP.

## 2017-04-25 NOTE — Telephone Encounter (Signed)
°  Relation to BL:TGAI Call back number: [phone] Pharmacy: [facility]   D.O.D : Dr. Larose Kells   Reason for call:  Patient states pharmacy denies receiving clonazePAM (KLONOPIN) 1 MG tablet and patient states she has not been able to sleep, please advise [facility] [phone] - [place], [address]  (rx was phone in on 04/22/17, pharmacy denies receiving a call)

## 2017-04-27 DIAGNOSIS — Z23 Encounter for immunization: Secondary | ICD-10-CM | POA: Diagnosis not present

## 2017-05-15 DIAGNOSIS — G4733 Obstructive sleep apnea (adult) (pediatric): Secondary | ICD-10-CM | POA: Diagnosis not present

## 2017-05-26 ENCOUNTER — Other Ambulatory Visit: Payer: Self-pay | Admitting: Family Medicine

## 2017-05-26 NOTE — Telephone Encounter (Signed)
[facility] requesting refill for clonazepam  Database ran and is on your desk for review  Last filled per database: 04/25/17 Last written: 04/25/17 Last ov: 01/25/17 Next ov: 07/25/17 Contract: not on file UDS: not on file

## 2017-05-27 MED ORDER — CLONAZEPAM 1 MG PO TABS: ORAL_TABLET | ORAL | 1 refills | Status: DC

## 2017-05-27 NOTE — Telephone Encounter (Signed)
Patient called and stts that she needed her RX - clonazepam, adv patient that order pending and awaiting on dr Etter Sjogren to approve, Patient stts that she cant sleep without this RX. Adv patient I will submit a message up   Thanks

## 2017-05-27 NOTE — Addendum Note (Signed)
Addended by: Kem Boroughs D on: 05/27/2017 04:25 PM   Modules accepted: Orders

## 2017-05-27 NOTE — Telephone Encounter (Signed)
Approved x 1    1 refill

## 2017-05-27 NOTE — Telephone Encounter (Signed)
rx called into [facility] and left message on patients machine

## 2017-05-27 NOTE — Telephone Encounter (Signed)
Dr Larose Kells can you approve in Dr. Ivy Lynn absence.  Patient states that she cannot sleep or wait until Monday.  I sent to Dr. Etter Sjogren earlier but no answer yet.  Will route to Dr. Etter Sjogren again as well

## 2017-06-15 DIAGNOSIS — G4733 Obstructive sleep apnea (adult) (pediatric): Secondary | ICD-10-CM | POA: Diagnosis not present

## 2017-06-16 ENCOUNTER — Other Ambulatory Visit: Payer: Self-pay | Admitting: Family Medicine

## 2017-06-16 DIAGNOSIS — R232 Flushing: Secondary | ICD-10-CM

## 2017-06-18 ENCOUNTER — Other Ambulatory Visit: Payer: Self-pay | Admitting: Family Medicine

## 2017-06-18 DIAGNOSIS — I10 Essential (primary) hypertension: Secondary | ICD-10-CM

## 2017-07-15 DIAGNOSIS — G4733 Obstructive sleep apnea (adult) (pediatric): Secondary | ICD-10-CM | POA: Diagnosis not present

## 2017-07-17 ENCOUNTER — Other Ambulatory Visit: Payer: Self-pay | Admitting: Family Medicine

## 2017-07-17 DIAGNOSIS — R232 Flushing: Secondary | ICD-10-CM

## 2017-07-25 ENCOUNTER — Ambulatory Visit (INDEPENDENT_AMBULATORY_CARE_PROVIDER_SITE_OTHER): Payer: 59 | Admitting: Family Medicine

## 2017-07-25 ENCOUNTER — Encounter: Payer: Self-pay | Admitting: Family Medicine

## 2017-07-25 VITALS — BP 130/80 | HR 75 | Temp 98.1°F | Resp 16 | Ht 64.0 in | Wt 195.4 lb

## 2017-07-25 DIAGNOSIS — E6609 Other obesity due to excess calories: Secondary | ICD-10-CM

## 2017-07-25 DIAGNOSIS — Z8619 Personal history of other infectious and parasitic diseases: Secondary | ICD-10-CM | POA: Diagnosis not present

## 2017-07-25 DIAGNOSIS — E785 Hyperlipidemia, unspecified: Secondary | ICD-10-CM | POA: Diagnosis not present

## 2017-07-25 DIAGNOSIS — I10 Essential (primary) hypertension: Secondary | ICD-10-CM | POA: Diagnosis not present

## 2017-07-25 DIAGNOSIS — Z79899 Other long term (current) drug therapy: Secondary | ICD-10-CM

## 2017-07-25 DIAGNOSIS — Z23 Encounter for immunization: Secondary | ICD-10-CM | POA: Diagnosis not present

## 2017-07-25 DIAGNOSIS — Z0000 Encounter for general adult medical examination without abnormal findings: Secondary | ICD-10-CM | POA: Diagnosis not present

## 2017-07-25 LAB — CBC WITH DIFFERENTIAL/PLATELET
Basophils Absolute: 0.1 10*3/uL (ref 0.0–0.1)
Basophils Relative: 1.3 % (ref 0.0–3.0)
EOS ABS: 0.1 10*3/uL (ref 0.0–0.7)
EOS PCT: 0.9 % (ref 0.0–5.0)
HCT: 40.2 % (ref 36.0–46.0)
HEMOGLOBIN: 13.1 g/dL (ref 12.0–15.0)
Lymphocytes Relative: 33.2 % (ref 12.0–46.0)
Lymphs Abs: 2.6 10*3/uL (ref 0.7–4.0)
MCHC: 32.6 g/dL (ref 30.0–36.0)
MCV: 90.9 fl (ref 78.0–100.0)
MONO ABS: 0.6 10*3/uL (ref 0.1–1.0)
Monocytes Relative: 7.5 % (ref 3.0–12.0)
NEUTROS PCT: 57.1 % (ref 43.0–77.0)
Neutro Abs: 4.4 10*3/uL (ref 1.4–7.7)
Platelets: 310 10*3/uL (ref 150.0–400.0)
RBC: 4.43 Mil/uL (ref 3.87–5.11)
RDW: 13.4 % (ref 11.5–15.5)
WBC: 7.8 10*3/uL (ref 4.0–10.5)

## 2017-07-25 LAB — POC URINALSYSI DIPSTICK (AUTOMATED)
BILIRUBIN UA: NEGATIVE
Glucose, UA: NEGATIVE
KETONES UA: NEGATIVE
Leukocytes, UA: NEGATIVE
Nitrite, UA: NEGATIVE
PH UA: 6 (ref 5.0–8.0)
PROTEIN UA: NEGATIVE
RBC UA: NEGATIVE
Spec Grav, UA: >=1.03 — AB (ref 1.010–1.025)
Urobilinogen, UA: 0.2 E.U./dL

## 2017-07-25 LAB — LIPID PANEL
CHOLESTEROL: 209 mg/dL — AB (ref 0–200)
HDL: 69.7 mg/dL (ref >39.00)
LDL CALC: 118 mg/dL — AB (ref 0–99)
NonHDL: 139.43
Total CHOL/HDL Ratio: 3
Triglycerides: 109 mg/dL (ref 0.0–149.0)
VLDL: 21.8 mg/dL (ref 0.0–40.0)

## 2017-07-25 LAB — COMPREHENSIVE METABOLIC PANEL
ALBUMIN: 4.1 g/dL (ref 3.5–5.2)
ALK PHOS: 68 U/L (ref 39–117)
ALT: 15 U/L (ref 0–35)
AST: 18 U/L (ref 0–37)
BUN: 20 mg/dL (ref 6–23)
CHLORIDE: 102 meq/L (ref 96–112)
CO2: 30 mEq/L (ref 19–32)
CREATININE: 0.76 mg/dL (ref 0.40–1.20)
Calcium: 9.3 mg/dL (ref 8.4–10.5)
GFR: 82.27 mL/min (ref >60.00)
Glucose, Bld: 99 mg/dL (ref 70–99)
POTASSIUM: 3.9 meq/L (ref 3.5–5.1)
SODIUM: 138 meq/L (ref 135–145)
TOTAL PROTEIN: 7 g/dL (ref 6.0–8.3)
Total Bilirubin: 0.5 mg/dL (ref 0.2–1.2)

## 2017-07-25 LAB — TSH: TSH: 7.25 u[IU]/mL — AB (ref 0.35–4.50)

## 2017-07-25 MED ORDER — VALACYCLOVIR HCL 1 G PO TABS: 1000.0000 mg | ORAL_TABLET | Freq: Two times a day (BID) | ORAL | 5 refills | Status: AC

## 2017-07-25 NOTE — Assessment & Plan Note (Signed)
Well controlled, no changes to meds. Encouraged heart healthy diet such as the DASH diet and exercise as tolerated.  °

## 2017-07-25 NOTE — Patient Instructions (Signed)
Preventive Care 40-64 Years, Female Preventive care refers to lifestyle choices and visits with your health care provider that can promote health and wellness. What does preventive care include?  A yearly physical exam. This is also called an annual well check.  Dental exams once or twice a year.  Routine eye exams. Ask your health care provider how often you should have your eyes checked.  Personal lifestyle choices, including: ? Daily care of your teeth and gums. ? Regular physical activity. ? Eating a healthy diet. ? Avoiding tobacco and drug use. ? Limiting alcohol use. ? Practicing safe sex. ? Taking low-dose aspirin daily starting at age 58. ? Taking vitamin and mineral supplements as recommended by your health care provider. What happens during an annual well check? The services and screenings done by your health care provider during your annual well check will depend on your age, overall health, lifestyle risk factors, and family history of disease. Counseling Your health care provider may ask you questions about your:  Alcohol use.  Tobacco use.  Drug use.  Emotional well-being.  Home and relationship well-being.  Sexual activity.  Eating habits.  Work and work Statistician.  Method of birth control.  Menstrual cycle.  Pregnancy history.  Screening You may have the following tests or measurements:  Height, weight, and BMI.  Blood pressure.  Lipid and cholesterol levels. These may be checked every 5 years, or more frequently if you are over 81 years old.  Skin check.  Lung cancer screening. You may have this screening every year starting at age 78 if you have a 30-pack-year history of smoking and currently smoke or have quit within the past 15 years.  Fecal occult blood test (FOBT) of the stool. You may have this test every year starting at age 65.  Flexible sigmoidoscopy or colonoscopy. You may have a sigmoidoscopy every 5 years or a colonoscopy  every 10 years starting at age 30.  Hepatitis C blood test.  Hepatitis B blood test.  Sexually transmitted disease (STD) testing.  Diabetes screening. This is done by checking your blood sugar (glucose) after you have not eaten for a while (fasting). You may have this done every 1-3 years.  Mammogram. This may be done every 1-2 years. Talk to your health care provider about when you should start having regular mammograms. This may depend on whether you have a family history of breast cancer.  BRCA-related cancer screening. This may be done if you have a family history of breast, ovarian, tubal, or peritoneal cancers.  Pelvic exam and Pap test. This may be done every 3 years starting at age 80. Starting at age 36, this may be done every 5 years if you have a Pap test in combination with an HPV test.  Bone density scan. This is done to screen for osteoporosis. You may have this scan if you are at high risk for osteoporosis.  Discuss your test results, treatment options, and if necessary, the need for more tests with your health care provider. Vaccines Your health care provider may recommend certain vaccines, such as:  Influenza vaccine. This is recommended every year.  Tetanus, diphtheria, and acellular pertussis (Tdap, Td) vaccine. You may need a Td booster every 10 years.  Varicella vaccine. You may need this if you have not been vaccinated.  Zoster vaccine. You may need this after age 5.  Measles, mumps, and rubella (MMR) vaccine. You may need at least one dose of MMR if you were born in  1957 or later. You may also need a second dose.  Pneumococcal 13-valent conjugate (PCV13) vaccine. You may need this if you have certain conditions and were not previously vaccinated.  Pneumococcal polysaccharide (PPSV23) vaccine. You may need one or two doses if you smoke cigarettes or if you have certain conditions.  Meningococcal vaccine. You may need this if you have certain  conditions.  Hepatitis A vaccine. You may need this if you have certain conditions or if you travel or work in places where you may be exposed to hepatitis A.  Hepatitis B vaccine. You may need this if you have certain conditions or if you travel or work in places where you may be exposed to hepatitis B.  Haemophilus influenzae type b (Hib) vaccine. You may need this if you have certain conditions.  Talk to your health care provider about which screenings and vaccines you need and how often you need them. This information is not intended to replace advice given to you by your health care provider. Make sure you discuss any questions you have with your health care provider. Document Released: 08/01/2015 Document Revised: 03/24/2016 Document Reviewed: 05/06/2015 Elsevier Interactive Patient Education  2018 Elsevier Inc.  

## 2017-07-25 NOTE — Addendum Note (Signed)
Addended by: Kem Boroughs D on: 07/25/2017 12:19 PM   Modules accepted: Orders

## 2017-07-25 NOTE — Assessment & Plan Note (Signed)
con't diet and exercise  

## 2017-07-25 NOTE — Progress Notes (Signed)
Subjective:     Grace Vaughn is a 62 y.o. female and is here for a comprehensive physical exam. The patient reports no problems.  Social History   Socioeconomic History  . Marital status: Married    Spouse name: Not on file  . Number of children: 2  . Years of education: BA  . Highest education level: Not on file  Social Needs  . Financial resource strain: Not on file  . Food insecurity - worry: Not on file  . Food insecurity - inability: Not on file  . Transportation needs - medical: Not on file  . Transportation needs - non-medical: Not on file  Occupational History    Comment: Scientist, forensic  Tobacco Use  . Smoking status: Never Smoker  . Smokeless tobacco: Never Used  Substance and Sexual Activity  . Alcohol use: Yes    Comment: rare  . Drug use: No  . Sexual activity: Yes  Other Topics Concern  . Not on file  Social History Narrative   Exercise--- walking 10,000 steps daily   Drinks 1-2 caffeine drinks a day    Health Maintenance  Topic Date Due  . MAMMOGRAM  07/20/2017  . COLONOSCOPY  07/18/2020  . TETANUS/TDAP  05/05/2025  . INFLUENZA VACCINE  Completed  . Hepatitis C Screening  Completed  . HIV Screening  Completed  . PAP SMEAR  Discontinued    The following portions of the patient's history were reviewed and updated as appropriate:  She  has a past medical history of Anxiety, Colon polyps, GERD (gastroesophageal reflux disease), Hypertension, Insomnia, and Vertigo. She does not have any pertinent problems on file. She  has a past surgical history that includes Cesarean section (1986, 1989); Abdominal hysterectomy (01/1999); Cholecystectomy; and Rotator cuff repair (Right). Her family history includes Breast cancer in her mother; Cataracts in her father and mother; Heart disease in her father and mother. She  reports that  has never smoked. she has never used smokeless tobacco. She reports that she drinks alcohol. She reports that she does not  use drugs. She has a current medication list which includes the following prescription(s): aspirin, bupropion, cholecalciferol, clonazepam, estradiol, hydrochlorothiazide, krill oil, metoprolol succinate, omeprazole, OVER THE COUNTER MEDICATION, potassium chloride sa, probiotic product, and valacyclovir. Current Outpatient Medications on File Prior to Visit  Medication Sig Dispense Refill  . aspirin 81 MG tablet Take 81 mg by mouth daily.    Marland Kitchen buPROPion (WELLBUTRIN SR) 150 MG 12 hr tablet Take 1 tablet (150 mg total) by mouth daily. 30 tablet 5  . cholecalciferol (VITAMIN D) 1000 UNITS tablet Take 1,000 Units by mouth daily.    . clonazePAM (KLONOPIN) 1 MG tablet TAKE 1 TABLET BY MOUTH AT BEDTIME AS NEEDED FOR ANXIETY 30 tablet 1  . estradiol (ESTRACE) 0.5 MG tablet TAKE 1 TABLET BY MOUTH EVERY DAY 30 tablet 0  . hydrochlorothiazide (HYDRODIURIL) 25 MG tablet TAKE 1 TABLET BY MOUTH DAILY 90 tablet 0  . KRILL OIL PO Take 1 capsule by mouth daily.    . metoprolol succinate (TOPROL-XL) 50 MG 24 hr tablet TAKE 1 TABLET BY MOUTH DAILY, WITH OR IMMEDIATELY FOLLOWING A MEAL 30 tablet 3  . omeprazole (PRILOSEC) 40 MG capsule take 1 capsule by mouth twice a day for 2 weeks THEN ONCE DAILY 90 capsule 0  . OVER THE COUNTER MEDICATION Take 1 tablet by mouth daily. Super k    . potassium chloride SA (K-DUR,KLOR-CON) 20 MEQ tablet TAKE 2 TABLETS  BY MOUTH DAILY 60 tablet 3  . Probiotic Product (PROBIOTIC DAILY PO) Take 1 capsule by mouth daily.     No current facility-administered medications on file prior to visit.    She is allergic to augmentin [amoxicillin-pot clavulanate]; cephalexin; diovan [valsartan]; erythromycin; lisinopril; and losartan..  Review of Systems Review of Systems  Constitutional: Negative for activity change, appetite change and fatigue.  HENT: Negative for hearing loss, congestion, tinnitus and ear discharge.  dentist q79m Eyes: Negative for visual disturbance (see optho q1y --  vision corrected to 20/20 with glasses).  Respiratory: Negative for cough, chest tightness and shortness of breath.   Cardiovascular: Negative for chest pain, palpitations and leg swelling.  Gastrointestinal: Negative for abdominal pain, diarrhea, constipation and abdominal distention.  Genitourinary: Negative for urgency, frequency, decreased urine volume and difficulty urinating.  Musculoskeletal: Negative for back pain, arthralgias and gait problem.  Skin: Negative for color change, pallor and rash.  Neurological: Negative for dizziness, light-headedness, numbness and headaches.  Hematological: Negative for adenopathy. Does not bruise/bleed easily.  Psychiatric/Behavioral: Negative for suicidal ideas, confusion, sleep disturbance, self-injury, dysphoric mood, decreased concentration and agitation.       Objective:    BP 130/80 (BP Location: Right Arm, Cuff Size: Large)   Pulse 75   Temp 98.1 F (36.7 C) (Oral)   Resp 16   Ht 5\' 4"  (1.626 m)   Wt 195 lb 6.4 oz (88.6 kg)   SpO2 98%   BMI 33.54 kg/m  General appearance: alert, cooperative, appears stated age and no distress Head: Normocephalic, without obvious abnormality, atraumatic Eyes: negative findings: lids and lashes normal and pupils equal, round, reactive to light and accomodation Ears: normal TM's and external ear canals both ears Nose: Nares normal. Septum midline. Mucosa normal. No drainage or sinus tenderness. Throat: lips, mucosa, and tongue normal; teeth and gums normal Neck: no adenopathy, no carotid bruit, no JVD, supple, symmetrical, trachea midline and thyroid not enlarged, symmetric, no tenderness/mass/nodules Back: symmetric, no curvature. ROM normal. No CVA tenderness. Lungs: clear to auscultation bilaterally Breasts: normal appearance, no masses or tenderness Heart: regular rate and rhythm, S1, S2 normal, no murmur, click, rub or gallop Abdomen: soft, non-tender; bowel sounds normal; no masses,  no  organomegaly Pelvic: not indicated; status post hysterectomy, negative ROS Extremities: extremities normal, atraumatic, no cyanosis or edema Pulses: 2+ and symmetric Skin: Skin color, texture, turgor normal. No rashes or lesions Lymph nodes: Cervical, supraclavicular, and axillary nodes normal. Neurologic: Alert and oriented X 3, normal strength and tone. Normal symmetric reflexes. Normal coordination and gait    Assessment:    Healthy female exam.      Plan:    ghm utd Check labs See After Visit Summary for Counseling Recommendations    1. Preventative health care See above  - CBC with Differential/Platelet - Comprehensive metabolic panel - Lipid panel - TSH - POCT Urinalysis Dipstick (Automated)  2. H/O cold sores  - valACYclovir (VALTREX) 1000 MG tablet; Take 1 tablet (1,000 mg total) by mouth 2 (two) times daily.  Dispense: 20 tablet; Refill: 5  3. Essential hypertension Well controlled, no changes to meds. Encouraged heart healthy diet such as the DASH diet and exercise as tolerated.  - CBC with Differential/Platelet - Comprehensive metabolic panel - Lipid panel - TSH - POCT Urinalysis Dipstick (Automated)  4. Hyperlipidemia, unspecified hyperlipidemia type Tolerating statin, encouraged heart healthy diet, avoid trans fats, minimize simple carbs and saturated fats. Increase exercise as tolerated  5. Class 2 obesity due  to excess calories without serious comorbidity in adult, unspecified BMI con't diet and exercise

## 2017-07-25 NOTE — Assessment & Plan Note (Signed)
Tolerating statin, encouraged heart healthy diet, avoid trans fats, minimize simple carbs and saturated fats. Increase exercise as tolerated 

## 2017-07-26 NOTE — Addendum Note (Signed)
Addended by: Kem Boroughs D on: 07/26/2017 09:14 AM   Modules accepted: Orders

## 2017-07-27 ENCOUNTER — Other Ambulatory Visit: Payer: Self-pay | Admitting: *Deleted

## 2017-07-27 ENCOUNTER — Other Ambulatory Visit: Payer: Self-pay | Admitting: Family Medicine

## 2017-07-27 DIAGNOSIS — K219 Gastro-esophageal reflux disease without esophagitis: Secondary | ICD-10-CM

## 2017-07-27 DIAGNOSIS — I10 Essential (primary) hypertension: Secondary | ICD-10-CM

## 2017-07-27 DIAGNOSIS — E785 Hyperlipidemia, unspecified: Secondary | ICD-10-CM

## 2017-07-29 LAB — PAIN MGMT, PROFILE 8 W/CONF, U
6 Acetylmorphine: NEGATIVE ng/mL (ref <10)
ALCOHOL METABOLITES: NEGATIVE ng/mL (ref <500)
ALPHAHYDROXYALPRAZOLAM: NEGATIVE ng/mL (ref <25)
ALPHAHYDROXYTRIAZOLAM: NEGATIVE ng/mL (ref <50)
AMINOCLONAZEPAM: 404 ng/mL — AB (ref <25)
AMPHETAMINES: NEGATIVE ng/mL (ref <500)
Alphahydroxymidazolam: NEGATIVE ng/mL (ref <50)
BUPRENORPHINE, URINE: NEGATIVE ng/mL (ref <5)
Benzodiazepines: POSITIVE ng/mL — AB (ref <100)
COCAINE METABOLITE: NEGATIVE ng/mL (ref <150)
CREATININE: 197.6 mg/dL
Hydroxyethylflurazepam: NEGATIVE ng/mL (ref <50)
LORAZEPAM: NEGATIVE ng/mL (ref <50)
MDMA: NEGATIVE ng/mL (ref <500)
Marijuana Metabolite: NEGATIVE ng/mL (ref <20)
Nordiazepam: NEGATIVE ng/mL (ref <50)
OXAZEPAM: NEGATIVE ng/mL (ref <50)
Opiates: NEGATIVE ng/mL (ref <100)
Oxidant: NEGATIVE ug/mL (ref <200)
Oxycodone: NEGATIVE ng/mL (ref <100)
TEMAZEPAM: NEGATIVE ng/mL (ref <50)
pH: 6.14 (ref 4.5–9.0)

## 2017-08-09 ENCOUNTER — Encounter: Payer: Self-pay | Admitting: Family Medicine

## 2017-08-09 MED ORDER — CLONAZEPAM 1 MG PO TABS: 1.0000 mg | ORAL_TABLET | Freq: Every evening | ORAL | 0 refills | Status: DC | PRN

## 2017-08-09 NOTE — Telephone Encounter (Signed)
Ok to refill x 1  

## 2017-08-09 NOTE — Telephone Encounter (Signed)
The reaction she had to shingrix is normal for some people-- we would advise her to still get the second one so she is completely protected. Is she due for klonopin refill?

## 2017-08-09 NOTE — Telephone Encounter (Signed)
Rx printed, awaiting DO signature.  

## 2017-08-09 NOTE — Telephone Encounter (Signed)
Last refill on 05/27/2017 #30 and 1rf

## 2017-08-09 NOTE — Telephone Encounter (Signed)
Rx faxed to [facility] pharmacy.  

## 2017-08-13 ENCOUNTER — Other Ambulatory Visit: Payer: Self-pay | Admitting: Family Medicine

## 2017-08-13 DIAGNOSIS — I10 Essential (primary) hypertension: Secondary | ICD-10-CM

## 2017-08-17 ENCOUNTER — Other Ambulatory Visit: Payer: Self-pay | Admitting: Family Medicine

## 2017-08-17 DIAGNOSIS — R232 Flushing: Secondary | ICD-10-CM

## 2017-08-19 ENCOUNTER — Other Ambulatory Visit: Payer: Self-pay | Admitting: Family Medicine

## 2017-08-19 DIAGNOSIS — Z1231 Encounter for screening mammogram for malignant neoplasm of breast: Secondary | ICD-10-CM

## 2017-08-22 ENCOUNTER — Ambulatory Visit (HOSPITAL_BASED_OUTPATIENT_CLINIC_OR_DEPARTMENT_OTHER)
Admission: RE | Admit: 2017-08-22 | Discharge: 2017-08-22 | Disposition: A | Discharge status: Home / Self Care | Payer: 59 | Source: Ambulatory Visit | Attending: Family Medicine | Admitting: Family Medicine

## 2017-08-22 DIAGNOSIS — Z1231 Encounter for screening mammogram for malignant neoplasm of breast: Secondary | ICD-10-CM | POA: Diagnosis not present

## 2017-08-30 DIAGNOSIS — L820 Inflamed seborrheic keratosis: Secondary | ICD-10-CM | POA: Diagnosis not present

## 2017-09-11 ENCOUNTER — Other Ambulatory Visit: Payer: Self-pay | Admitting: Family Medicine

## 2017-09-11 DIAGNOSIS — I10 Essential (primary) hypertension: Secondary | ICD-10-CM

## 2017-09-14 ENCOUNTER — Other Ambulatory Visit: Payer: Self-pay | Admitting: Family Medicine

## 2017-09-15 MED ORDER — CLONAZEPAM 1 MG PO TABS: 1.0000 mg | ORAL_TABLET | Freq: Every evening | ORAL | 0 refills | Status: DC | PRN

## 2017-09-15 NOTE — Telephone Encounter (Signed)
Requesting:clonzepam Contract:07/25/17 UDS:07/25/17 low risk Last Visit:07/25/17 Next Visit:10/25/17 Last Refill:08/09/17  Please Advise in absence of pcp

## 2017-09-17 ENCOUNTER — Other Ambulatory Visit: Payer: Self-pay | Admitting: Family Medicine

## 2017-09-17 DIAGNOSIS — R232 Flushing: Secondary | ICD-10-CM

## 2017-09-18 ENCOUNTER — Other Ambulatory Visit: Payer: Self-pay | Admitting: Family Medicine

## 2017-09-18 DIAGNOSIS — I10 Essential (primary) hypertension: Secondary | ICD-10-CM

## 2017-10-05 ENCOUNTER — Other Ambulatory Visit: Payer: Self-pay | Admitting: Family Medicine

## 2017-10-09 ENCOUNTER — Other Ambulatory Visit: Payer: Self-pay | Admitting: Family Medicine

## 2017-10-09 DIAGNOSIS — I10 Essential (primary) hypertension: Secondary | ICD-10-CM

## 2017-10-10 ENCOUNTER — Other Ambulatory Visit: Payer: Self-pay | Admitting: Family Medicine

## 2017-10-15 ENCOUNTER — Other Ambulatory Visit: Payer: Self-pay | Admitting: Family Medicine

## 2017-10-16 ENCOUNTER — Other Ambulatory Visit: Payer: Self-pay | Admitting: Family Medicine

## 2017-10-16 DIAGNOSIS — K219 Gastro-esophageal reflux disease without esophagitis: Secondary | ICD-10-CM

## 2017-10-18 MED ORDER — CLONAZEPAM 1 MG PO TABS: 1.0000 mg | ORAL_TABLET | Freq: Every evening | ORAL | 0 refills | Status: DC | PRN

## 2017-10-18 NOTE — Telephone Encounter (Signed)
Requesting:klonopin Contract:yes UDS:low risk next screen 01/2018 Last OV:07/25/17 Next OV:10/25/17 Last Refill:09/15/17 Database:no concerns   Please advise

## 2017-10-21 ENCOUNTER — Encounter: Payer: Self-pay | Admitting: Family Medicine

## 2017-10-21 DIAGNOSIS — E0590 Thyrotoxicosis, unspecified without thyrotoxic crisis or storm: Secondary | ICD-10-CM

## 2017-10-25 ENCOUNTER — Ambulatory Visit: Payer: 59 | Admitting: Family Medicine

## 2017-10-26 ENCOUNTER — Other Ambulatory Visit: Payer: Self-pay | Admitting: Family Medicine

## 2017-10-26 DIAGNOSIS — R232 Flushing: Secondary | ICD-10-CM

## 2017-10-27 ENCOUNTER — Other Ambulatory Visit: Payer: Self-pay | Admitting: Family Medicine

## 2017-10-27 DIAGNOSIS — R232 Flushing: Secondary | ICD-10-CM

## 2017-10-27 MED ORDER — ESTRADIOL 0.5 MG PO TABS: 0.5000 mg | ORAL_TABLET | Freq: Every day | ORAL | 0 refills | Status: DC

## 2017-10-29 ENCOUNTER — Other Ambulatory Visit: Payer: Self-pay | Admitting: Family Medicine

## 2017-10-29 DIAGNOSIS — I10 Essential (primary) hypertension: Secondary | ICD-10-CM

## 2017-11-02 ENCOUNTER — Other Ambulatory Visit (INDEPENDENT_AMBULATORY_CARE_PROVIDER_SITE_OTHER): Payer: 59

## 2017-11-02 DIAGNOSIS — I10 Essential (primary) hypertension: Secondary | ICD-10-CM | POA: Diagnosis not present

## 2017-11-02 DIAGNOSIS — E0590 Thyrotoxicosis, unspecified without thyrotoxic crisis or storm: Secondary | ICD-10-CM | POA: Diagnosis not present

## 2017-11-02 DIAGNOSIS — E785 Hyperlipidemia, unspecified: Secondary | ICD-10-CM | POA: Diagnosis not present

## 2017-11-02 LAB — COMPREHENSIVE METABOLIC PANEL
ALBUMIN: 3.9 g/dL (ref 3.5–5.2)
ALK PHOS: 62 U/L (ref 39–117)
ALT: 17 U/L (ref 0–35)
AST: 17 U/L (ref 0–37)
BILIRUBIN TOTAL: 0.4 mg/dL (ref 0.2–1.2)
BUN: 25 mg/dL — AB (ref 6–23)
CO2: 28 mEq/L (ref 19–32)
CREATININE: 0.74 mg/dL (ref 0.40–1.20)
Calcium: 9.3 mg/dL (ref 8.4–10.5)
Chloride: 100 mEq/L (ref 96–112)
GFR: 84.77 mL/min (ref >60.00)
GLUCOSE: 90 mg/dL (ref 70–99)
POTASSIUM: 3.6 meq/L (ref 3.5–5.1)
SODIUM: 138 meq/L (ref 135–145)
TOTAL PROTEIN: 6.9 g/dL (ref 6.0–8.3)

## 2017-11-02 LAB — LIPID PANEL
CHOLESTEROL: 178 mg/dL (ref 0–200)
HDL: 61.8 mg/dL (ref >39.00)
LDL Cholesterol: 92 mg/dL (ref 0–99)
NONHDL: 116.68
Total CHOL/HDL Ratio: 3
Triglycerides: 121 mg/dL (ref 0.0–149.0)
VLDL: 24.2 mg/dL (ref 0.0–40.0)

## 2017-11-02 LAB — T3, FREE: T3 FREE: 3.1 pg/mL (ref 2.3–4.2)

## 2017-11-02 LAB — TSH: TSH: 4.37 u[IU]/mL (ref 0.35–4.50)

## 2017-11-02 LAB — T4, FREE: FREE T4: 0.76 ng/dL (ref 0.60–1.60)

## 2017-11-05 ENCOUNTER — Other Ambulatory Visit: Payer: Self-pay | Admitting: Family Medicine

## 2017-11-05 DIAGNOSIS — K219 Gastro-esophageal reflux disease without esophagitis: Secondary | ICD-10-CM

## 2017-11-06 ENCOUNTER — Other Ambulatory Visit: Payer: Self-pay | Admitting: Family Medicine

## 2017-11-06 DIAGNOSIS — I10 Essential (primary) hypertension: Secondary | ICD-10-CM

## 2017-11-16 ENCOUNTER — Other Ambulatory Visit: Payer: Self-pay | Admitting: Family Medicine

## 2017-11-18 MED ORDER — CLONAZEPAM 1 MG PO TABS: 1.0000 mg | ORAL_TABLET | Freq: Every evening | ORAL | 0 refills | Status: DC | PRN

## 2017-11-18 NOTE — Telephone Encounter (Signed)
Requesting:Clonazepam Contract:07/25/17 UDS:07/25/17 low risk Last Visit:07/25/17 Next Visit:11/22/17 Last Refill:10/18/17  Data base ran and printed on desk for review  Please Advise

## 2017-11-22 ENCOUNTER — Ambulatory Visit (INDEPENDENT_AMBULATORY_CARE_PROVIDER_SITE_OTHER): Payer: 59 | Admitting: Family Medicine

## 2017-11-22 ENCOUNTER — Encounter: Payer: Self-pay | Admitting: Family Medicine

## 2017-11-22 VITALS — BP 133/87 | HR 68 | Temp 98.2°F | Resp 16 | Ht 64.17 in | Wt 198.4 lb

## 2017-11-22 DIAGNOSIS — R5383 Other fatigue: Secondary | ICD-10-CM

## 2017-11-22 DIAGNOSIS — R197 Diarrhea, unspecified: Secondary | ICD-10-CM

## 2017-11-22 DIAGNOSIS — Z803 Family history of malignant neoplasm of breast: Secondary | ICD-10-CM

## 2017-11-22 NOTE — Patient Instructions (Signed)

## 2017-11-22 NOTE — Progress Notes (Signed)
Subjective:  I acted as a Education administrator for [employer]. Grace Vaughn, Grace Vaughn   Patient ID: Grace Vaughn, female    DOB: January 10, 1956, 62 y.o.   MRN: [id number]  Chief Complaint  Patient presents with  . Fatigue    HPI  Patient is in today for fatigue,   She is having memory problems as well -- she wears her cpap all night usually She forgot a coworkers name and she had to look it up.   She is also c/o loose stools esp after eating.  She is taking probiotic daily.  No abd pain-- some cramping when she has to run to [place], no fevers, no blood Pt also concerned because her mother and 2 sisters have breast cancer.  Her mammogram showed dense breasts and her sisters dr told her she should have an MRI.  Patient Care Team: Grace Vaughn, Grace Apa, DO as PCP - General (Family Medicine) Dohmeier, Asencion Partridge, MD as Consulting Physician (Neurology) Grace Gentry, MD as Consulting Physician (Sports Medicine)   Past Medical History:  Diagnosis Date  . Anxiety   . Colon polyps   . GERD (gastroesophageal reflux disease)   . Hypertension   . Insomnia   . Vertigo     Past Surgical History:  Procedure Laterality Date  . ABDOMINAL HYSTERECTOMY  01/1999  . [place]  . CHOLECYSTECTOMY    . ROTATOR CUFF REPAIR Right     Family History  Problem Relation Age of Onset  . Breast cancer Mother   . Heart disease Mother        cardiomyopathy  . Cataracts Mother   . Heart disease Father        CHF  . Cataracts Father     Social History   Socioeconomic History  . Marital status: Married    Spouse name: Not on file  . Number of children: 2  . Years of education: BA  . Highest education level: Not on file  Occupational History    Comment: [employer]  Social Needs  . Financial resource strain: Not on file  . Food insecurity:    Worry: Not on file    Inability: Not on file  . Transportation needs:    Medical: Not on file    Non-medical: Not on file  Tobacco Use  .  Smoking status: Never Smoker  . Smokeless tobacco: Never Used  Substance and Sexual Activity  . Alcohol use: Yes    Comment: rare  . Drug use: No  . Sexual activity: Yes  Lifestyle  . Physical activity:    Days per week: Not on file    Minutes per session: Not on file  . Stress: Not on file  Relationships  . Social connections:    Talks on phone: Not on file    Gets together: Not on file    Attends religious service: Not on file    Active member of club or organization: Not on file    Attends meetings of clubs or organizations: Not on file    Relationship status: Not on file  . Intimate partner violence:    Fear of current or ex partner: Not on file    Emotionally abused: Not on file    Physically abused: Not on file    Forced sexual activity: Not on file  Other Topics Concern  . Not on file  Social History Narrative   Exercise--- walking 10,000 steps daily   Drinks 1-2 caffeine  drinks a day     Outpatient Medications Prior to Visit  Medication Sig Dispense Refill  . aspirin 81 MG tablet Take 81 mg by mouth daily.    Marland Kitchen buPROPion (WELLBUTRIN SR) 150 MG 12 hr tablet TAKE 1 TABLET(150 MG) BY MOUTH DAILY 90 tablet 0  . cholecalciferol (VITAMIN D) 1000 UNITS tablet Take 1,000 Units by mouth daily.    . clonazePAM (KLONOPIN) 1 MG tablet Take 1 tablet (1 mg total) by mouth at bedtime as needed for anxiety. For sleep due to restless leg syndrome 30 tablet 0  . estradiol (ESTRACE) 0.5 MG tablet TAKE 1 TABLET(0.5 MG) BY MOUTH DAILY 90 tablet 0  . hydrochlorothiazide (HYDRODIURIL) 25 MG tablet TAKE 1 TABLET BY MOUTH DAILY 90 tablet 0  . hydrochlorothiazide (HYDRODIURIL) 25 MG tablet TAKE 1 TABLET BY MOUTH DAILY 90 tablet 0  . KRILL OIL PO Take 1 capsule by mouth daily.    . metoprolol succinate (TOPROL-XL) 50 MG 24 hr tablet TAKE 1 TABLET BY MOUTH DAILY WITH A MEAL OR IMMEDIATELY FOLLOWING A MEAL 90 tablet 0  . omeprazole (PRILOSEC) 40 MG capsule TAKE 1 CAPSULE BY MOUTH TWICE DAILY  FOR 2 WEEKS, THEN 1 CAPSULE EVERY DAY THEREAFTER 106 capsule 0  . omeprazole (PRILOSEC) 40 MG capsule TAKE 1 CAPSULE BY MOUTH TWICE DAILY FOR 2 WEEKS, THEN 1 CAPSULE EVERY DAY THEREAFTER 90 capsule 0  . OVER THE COUNTER MEDICATION Take 1 tablet by mouth daily. Super k    . potassium chloride SA (K-DUR,KLOR-CON) 20 MEQ tablet TAKE 2 TABLETS BY MOUTH DAILY 180 tablet 3  . Probiotic Product (PROBIOTIC DAILY PO) Take 1 capsule by mouth daily.    . valACYclovir (VALTREX) 1000 MG tablet Take 1 tablet (1,000 mg total) by mouth 2 (two) times daily. 20 tablet 5   No facility-administered medications prior to visit.     Allergies  Allergen Reactions  . Augmentin [Amoxicillin-Pot Clavulanate]     ? reaction  . Cephalexin Nausea And Vomiting  . Diovan [Valsartan]   . Erythromycin Nausea And Vomiting  . Lisinopril Cough  . Losartan     Pain in extremities, paresthesias     Review of Systems  Constitutional: Positive for malaise/fatigue. Negative for fever.  HENT: Negative for congestion.   Eyes: Negative for blurred vision.  Respiratory: Negative for cough and shortness of breath.   Cardiovascular: Negative for chest pain, palpitations and leg swelling.  Gastrointestinal: Negative for vomiting.  Musculoskeletal: Negative for back pain.  Skin: Negative for rash.  Neurological: Negative for loss of consciousness and headaches.  Psychiatric/Behavioral: Positive for memory loss. Negative for depression, hallucinations, substance abuse and suicidal ideas. The patient is not nervous/anxious.        Objective:    Physical Exam  Constitutional: She is oriented to person, place, and time. She appears well-developed and well-nourished.  HENT:  Head: Normocephalic and atraumatic.  Eyes: Conjunctivae and EOM are normal.  Neck: Normal range of motion. Neck supple. No JVD present. Carotid bruit is not present. No thyromegaly present.  Cardiovascular: Normal rate, regular rhythm and normal heart  sounds.  No murmur heard. Pulmonary/Chest: Effort normal and breath sounds normal. No respiratory distress. She has no wheezes. She has no rales. She exhibits no tenderness.  Musculoskeletal: She exhibits no edema.  Neurological: She is alert and oriented to person, place, and time.  Psychiatric: She has a normal mood and affect.  Nursing note and vitals reviewed.   BP 133/87 (BP Location: Left  Arm, Patient Position: Sitting, Cuff Size: Normal)   Pulse 68   Temp 98.2 F (36.8 C) (Oral)   Resp 16   Ht 5' 4.17" (1.63 m)   Wt 198 lb 6.4 oz (90 kg)   SpO2 98%   BMI 33.87 kg/m  Wt Readings from Last 3 Encounters:  11/22/17 198 lb 6.4 oz (90 kg)  07/25/17 195 lb 6.4 oz (88.6 kg)  03/01/17 192 lb 12.8 oz (87.5 kg)   BP Readings from Last 3 Encounters:  11/22/17 133/87  07/25/17 130/80  03/01/17 121/80     Immunization History  Administered Date(s) Administered  . Influenza,inj,Quad PF,6+ Mos 05/06/2015  . Influenza-Unspecified 03/19/2016  . Tdap 05/06/2015  . Zoster Recombinat (Shingrix) 07/25/2016    Health Maintenance  Topic Date Due  . INFLUENZA VACCINE  02/16/2018  . MAMMOGRAM  08/22/2018  . COLONOSCOPY  07/18/2020  . TETANUS/TDAP  05/05/2025  . Hepatitis C Screening  Completed  . HIV Screening  Completed  . PAP SMEAR  Discontinued    Lab Results  Component Value Date   WBC 7.8 07/25/2017   HGB 13.1 07/25/2017   HCT 40.2 07/25/2017   PLT 310.0 07/25/2017   GLUCOSE 90 11/02/2017   CHOL 178 11/02/2017   TRIG 121.0 11/02/2017   HDL 61.80 11/02/2017   LDLCALC 92 11/02/2017   ALT 17 11/02/2017   AST 17 11/02/2017   NA 138 11/02/2017   K 3.6 11/02/2017   CL 100 11/02/2017   CREATININE 0.74 11/02/2017   BUN 25 (H) 11/02/2017   CO2 28 11/02/2017   TSH 4.37 11/02/2017    Lab Results  Component Value Date   TSH 4.37 11/02/2017   Lab Results  Component Value Date   WBC 7.8 07/25/2017   HGB 13.1 07/25/2017   HCT 40.2 07/25/2017   MCV 90.9 07/25/2017    PLT 310.0 07/25/2017   Lab Results  Component Value Date   NA 138 11/02/2017   K 3.6 11/02/2017   CO2 28 11/02/2017   GLUCOSE 90 11/02/2017   BUN 25 (H) 11/02/2017   CREATININE 0.74 11/02/2017   BILITOT 0.4 11/02/2017   ALKPHOS 62 11/02/2017   AST 17 11/02/2017   ALT 17 11/02/2017   PROT 6.9 11/02/2017   ALBUMIN 3.9 11/02/2017   CALCIUM 9.3 11/02/2017   GFR 84.77 11/02/2017   Lab Results  Component Value Date   CHOL 178 11/02/2017   Lab Results  Component Value Date   HDL 61.80 11/02/2017   Lab Results  Component Value Date   LDLCALC 92 11/02/2017   Lab Results  Component Value Date   TRIG 121.0 11/02/2017   Lab Results  Component Value Date   CHOLHDL 3 11/02/2017   No results found for: HGBA1C       Assessment & Plan:   Problem List Items Addressed This Visit      Unprioritized   Diarrhea    Inc fiber in diet  Take probiotics Offered meds and gi referral-- she wants to hole off Suspect IBS       Relevant Orders   Clostridium difficile EIA   Stool culture   Family history of breast cancer - Primary    Dense mammo and recent family hx of breast cancer in 2 sisters and mother Mri ordered      Relevant Orders   MR BREAST BILATERAL WO CONTRAST   Other fatigue    Labs done recently and reviewed Take vita b complex daily Using cpap  every night          I am having Deliah Boston. Zito "[name]" maintain her cholecalciferol, aspirin, KRILL OIL PO, Probiotic Product (PROBIOTIC DAILY PO), OVER THE COUNTER MEDICATION, valACYclovir, potassium chloride SA, hydrochlorothiazide, buPROPion, omeprazole, estradiol, metoprolol succinate, omeprazole, hydrochlorothiazide, and clonazePAM.  No orders of the defined types were placed in this encounter.   CMA served as Education administrator during this visit. History, Physical and Plan performed by medical provider. Documentation and orders reviewed and attested to.  Ann Held, DO

## 2017-11-23 DIAGNOSIS — Z803 Family history of malignant neoplasm of breast: Secondary | ICD-10-CM | POA: Insufficient documentation

## 2017-11-23 DIAGNOSIS — R197 Diarrhea, unspecified: Secondary | ICD-10-CM | POA: Insufficient documentation

## 2017-11-23 DIAGNOSIS — R5383 Other fatigue: Secondary | ICD-10-CM | POA: Insufficient documentation

## 2017-11-23 NOTE — Assessment & Plan Note (Signed)
Labs done recently and reviewed Take vita b complex daily Using cpap every night

## 2017-11-23 NOTE — Assessment & Plan Note (Signed)
Inc fiber in diet  Take probiotics Offered meds and gi referral-- she wants to hole off Suspect IBS

## 2017-11-23 NOTE — Assessment & Plan Note (Signed)
Dense mammo and recent family hx of breast cancer in 2 sisters and mother Mri ordered

## 2017-12-06 ENCOUNTER — Telehealth: Payer: Self-pay | Admitting: *Deleted

## 2017-12-06 DIAGNOSIS — Z803 Family history of malignant neoplasm of breast: Secondary | ICD-10-CM

## 2017-12-06 NOTE — Telephone Encounter (Signed)
Left message on machine to call back.  Ins will not cover MRI of breast.  It was determine not medically necessary.

## 2017-12-07 ENCOUNTER — Other Ambulatory Visit: Payer: Self-pay | Admitting: Family Medicine

## 2017-12-07 DIAGNOSIS — I10 Essential (primary) hypertension: Secondary | ICD-10-CM

## 2017-12-13 NOTE — Telephone Encounter (Signed)
Pt is returning sheketia

## 2017-12-13 NOTE — Telephone Encounter (Signed)
Spoke with patient.  Will call insurance tomorrow to give additional information.  It looks like the information about Mother and 2 sisters were not in clinical information.

## 2017-12-14 ENCOUNTER — Other Ambulatory Visit: Payer: Self-pay | Admitting: *Deleted

## 2017-12-15 NOTE — Telephone Encounter (Signed)
Can you check the code on the mri?

## 2017-12-15 NOTE — Telephone Encounter (Signed)
Patient called and notified of appt at [facility] on 12/20/17 at 130

## 2017-12-15 NOTE — Addendum Note (Signed)
Addended by: Kem Boroughs D on: 12/15/2017 11:29 AM   Modules accepted: Orders

## 2017-12-15 NOTE — Telephone Encounter (Signed)
precerted MR for breast.  Grace Vaughn should be calling to schedule.  Patient notified.

## 2017-12-16 ENCOUNTER — Other Ambulatory Visit: Payer: Self-pay | Admitting: Family Medicine

## 2017-12-16 MED ORDER — CLONAZEPAM 1 MG PO TABS: 1.0000 mg | ORAL_TABLET | Freq: Every evening | ORAL | 0 refills | Status: DC | PRN

## 2017-12-16 NOTE — Telephone Encounter (Signed)
Requesting:clonazepam Contract:07/25/17 UDS:07/25/17 low risk Last Visit:11/22/17 Next Visit:none with pcp Last Refill:11/18/17  No discrepancies  Please Advise Data base ran and is on your desk for review.

## 2017-12-20 ENCOUNTER — Ambulatory Visit (HOSPITAL_COMMUNITY): Admission: RE | Admit: 2017-12-20 | Payer: 59 | Source: Ambulatory Visit

## 2017-12-22 ENCOUNTER — Ambulatory Visit (HOSPITAL_COMMUNITY)
Admission: RE | Admit: 2017-12-22 | Discharge: 2017-12-22 | Disposition: A | Discharge status: Home / Self Care | Payer: 59 | Source: Ambulatory Visit | Attending: Family Medicine | Admitting: Family Medicine

## 2017-12-22 DIAGNOSIS — N6489 Other specified disorders of breast: Secondary | ICD-10-CM | POA: Diagnosis not present

## 2017-12-22 DIAGNOSIS — Z803 Family history of malignant neoplasm of breast: Secondary | ICD-10-CM | POA: Insufficient documentation

## 2017-12-22 DIAGNOSIS — Z1231 Encounter for screening mammogram for malignant neoplasm of breast: Secondary | ICD-10-CM | POA: Diagnosis not present

## 2017-12-22 MED ORDER — GADOBENATE DIMEGLUMINE 529 MG/ML IV SOLN
20.0000 mL | Freq: Once | INTRAVENOUS | Status: AC | PRN
  Administered 2017-12-22: 20 mL via INTRAVENOUS

## 2017-12-26 LAB — POCT I-STAT CREATININE: Creatinine, Ser: 0.8 mg/dL (ref 0.44–1.00)

## 2017-12-29 DIAGNOSIS — L820 Inflamed seborrheic keratosis: Secondary | ICD-10-CM | POA: Diagnosis not present

## 2017-12-29 DIAGNOSIS — L719 Rosacea, unspecified: Secondary | ICD-10-CM | POA: Diagnosis not present

## 2017-12-29 DIAGNOSIS — Z85828 Personal history of other malignant neoplasm of skin: Secondary | ICD-10-CM | POA: Diagnosis not present

## 2017-12-29 DIAGNOSIS — D2262 Melanocytic nevi of left upper limb, including shoulder: Secondary | ICD-10-CM | POA: Diagnosis not present

## 2018-01-13 ENCOUNTER — Ambulatory Visit (INDEPENDENT_AMBULATORY_CARE_PROVIDER_SITE_OTHER): Payer: 59

## 2018-01-13 ENCOUNTER — Other Ambulatory Visit: Payer: Self-pay | Admitting: Family Medicine

## 2018-01-13 DIAGNOSIS — Z23 Encounter for immunization: Secondary | ICD-10-CM

## 2018-01-13 NOTE — Progress Notes (Signed)
Pre visit review using our clinic review tool, if applicable. No additional management support is needed unless otherwise documented below in the visit note.  Patient here for 2nd shingrix vaccine First vaccine given 07/25/16.. Vaccine given IM in left deltoid. Tolerated well. VIS sheet given.

## 2018-01-16 MED ORDER — BUPROPION HCL ER (SR) 150 MG PO TB12: 150.0000 mg | ORAL_TABLET | Freq: Every day | ORAL | 0 refills | Status: DC

## 2018-01-22 ENCOUNTER — Other Ambulatory Visit: Payer: Self-pay | Admitting: Family Medicine

## 2018-01-22 DIAGNOSIS — F329 Major depressive disorder, single episode, unspecified: Secondary | ICD-10-CM

## 2018-01-22 DIAGNOSIS — F32A Depression, unspecified: Secondary | ICD-10-CM

## 2018-01-23 ENCOUNTER — Other Ambulatory Visit: Payer: Self-pay | Admitting: Family Medicine

## 2018-01-23 MED ORDER — CLONAZEPAM 1 MG PO TABS: 1.0000 mg | ORAL_TABLET | Freq: Every evening | ORAL | 0 refills | Status: DC | PRN

## 2018-01-23 NOTE — Telephone Encounter (Signed)
Requesting: Clonazepam 1mg  HS prn Contract: yes UDS: 07/25/17 Last OV: 11/22/17 Next Ov: N/A Last refill: 12/16/17 #30, 0RF Database: no discrepancies found  Please advise.

## 2018-02-01 ENCOUNTER — Other Ambulatory Visit: Payer: Self-pay | Admitting: Family Medicine

## 2018-02-01 DIAGNOSIS — I10 Essential (primary) hypertension: Secondary | ICD-10-CM

## 2018-02-03 ENCOUNTER — Other Ambulatory Visit: Payer: Self-pay | Admitting: Family Medicine

## 2018-02-03 DIAGNOSIS — R232 Flushing: Secondary | ICD-10-CM

## 2018-03-02 ENCOUNTER — Other Ambulatory Visit: Payer: Self-pay | Admitting: Family Medicine

## 2018-03-02 DIAGNOSIS — F32A Depression, unspecified: Secondary | ICD-10-CM

## 2018-03-02 DIAGNOSIS — F329 Major depressive disorder, single episode, unspecified: Secondary | ICD-10-CM

## 2018-03-02 MED ORDER — CLONAZEPAM 1 MG PO TABS: 1.0000 mg | ORAL_TABLET | Freq: Every evening | ORAL | 0 refills | Status: DC | PRN

## 2018-03-02 NOTE — Telephone Encounter (Signed)
Refill request for clonazepam.   Last OV: 11/22/2017 Last Fill: 01/23/2018 #30 and 0RF UDS: 07/25/2017 Low risk

## 2018-03-05 ENCOUNTER — Other Ambulatory Visit: Payer: Self-pay | Admitting: Family Medicine

## 2018-03-05 DIAGNOSIS — I10 Essential (primary) hypertension: Secondary | ICD-10-CM

## 2018-03-06 NOTE — Telephone Encounter (Signed)
HCTZ refill sent to pharmacy. Pt last seen 11/2017 and has no future appts scheduled. When should pt follow up in the office?

## 2018-03-08 NOTE — Telephone Encounter (Signed)
nov 

## 2018-03-08 NOTE — Telephone Encounter (Signed)
Please call pt to schedule appt in November. Thanks!

## 2018-03-08 NOTE — Telephone Encounter (Signed)
Pt scheduled 05/25/18 at 4pm

## 2018-04-01 ENCOUNTER — Other Ambulatory Visit: Payer: Self-pay | Admitting: Family Medicine

## 2018-04-01 DIAGNOSIS — K219 Gastro-esophageal reflux disease without esophagitis: Secondary | ICD-10-CM

## 2018-04-05 ENCOUNTER — Other Ambulatory Visit: Payer: Self-pay | Admitting: Family Medicine

## 2018-04-05 DIAGNOSIS — F32A Depression, unspecified: Secondary | ICD-10-CM

## 2018-04-05 DIAGNOSIS — F329 Major depressive disorder, single episode, unspecified: Secondary | ICD-10-CM

## 2018-04-06 MED ORDER — CLONAZEPAM 1 MG PO TABS: 1.0000 mg | ORAL_TABLET | Freq: Every evening | ORAL | 0 refills | Status: DC | PRN

## 2018-04-06 NOTE — Telephone Encounter (Signed)
Requesting: clonazepam 1mg  HS prn Contract: yes UDS: 07/25/17 Last OV: 11/22/17 Next Ov: 05/25/18 Last refill: 03/02/18, #30, 0RF Database: no discrepancies found  Please advise.

## 2018-04-19 ENCOUNTER — Other Ambulatory Visit: Payer: Self-pay | Admitting: Family Medicine

## 2018-05-13 ENCOUNTER — Other Ambulatory Visit: Payer: Self-pay | Admitting: Family Medicine

## 2018-05-13 DIAGNOSIS — F32A Depression, unspecified: Secondary | ICD-10-CM

## 2018-05-13 DIAGNOSIS — F329 Major depressive disorder, single episode, unspecified: Secondary | ICD-10-CM

## 2018-05-15 MED ORDER — CLONAZEPAM 1 MG PO TABS: 1.0000 mg | ORAL_TABLET | Freq: Every evening | ORAL | 0 refills | Status: DC | PRN

## 2018-05-15 NOTE — Telephone Encounter (Signed)
Last clonazepam RX:  04/06/18, #30 Last OV: 11/22/17 Next OV: 05/25/18 UDS: 07/25/17, low risk. Repeat 01/2018 CSC: 07/25/17  CSR: No discrepancies identified

## 2018-05-25 ENCOUNTER — Ambulatory Visit: Payer: BLUE CROSS/BLUE SHIELD | Admitting: Family Medicine

## 2018-05-25 ENCOUNTER — Ambulatory Visit (HOSPITAL_BASED_OUTPATIENT_CLINIC_OR_DEPARTMENT_OTHER)
Admission: RE | Admit: 2018-05-25 | Discharge: 2018-05-25 | Disposition: A | Discharge status: Home / Self Care | Payer: BLUE CROSS/BLUE SHIELD | Source: Ambulatory Visit | Attending: Family Medicine | Admitting: Family Medicine

## 2018-05-25 VITALS — BP 126/53 | HR 70 | Temp 98.1°F | Resp 16 | Ht 64.0 in | Wt 197.8 lb

## 2018-05-25 DIAGNOSIS — M25561 Pain in right knee: Secondary | ICD-10-CM | POA: Diagnosis not present

## 2018-05-25 DIAGNOSIS — M76891 Other specified enthesopathies of right lower limb, excluding foot: Secondary | ICD-10-CM | POA: Insufficient documentation

## 2018-05-25 NOTE — Progress Notes (Signed)
Patient ID: Grace Vaughn, female    DOB: 03-10-1956  Age: 62 y.o. MRN: [id number]    Subjective:  Subjective  HPI Grace Vaughn presents for R knee pain .  Pt fell last Saturday.   She fell walking down steps.   No other complaints.  She is really not sure what exactly happened.    Review of Systems  Constitutional: Negative for appetite change, diaphoresis, fatigue and unexpected weight change.  Eyes: Negative for pain, redness and visual disturbance.  Respiratory: Negative for cough, chest tightness, shortness of breath and wheezing.   Cardiovascular: Negative for chest pain, palpitations and leg swelling.  Endocrine: Negative for cold intolerance, heat intolerance, polydipsia, polyphagia and polyuria.  Genitourinary: Negative for difficulty urinating, dysuria and frequency.  Musculoskeletal: Positive for arthralgias and joint swelling.  Neurological: Negative for dizziness, light-headedness, numbness and headaches.    History Past Medical History:  Diagnosis Date  . Anxiety   . Colon polyps   . GERD (gastroesophageal reflux disease)   . Hypertension   . Insomnia   . Vertigo     She has a past surgical history that includes Cesarean section (1986, 1989); Abdominal hysterectomy (01/1999); Cholecystectomy; and Rotator cuff repair (Right).   Her family history includes Breast cancer in her mother; Cataracts in her father and mother; Heart disease in her father and mother.She reports that she has never smoked. She has never used smokeless tobacco. She reports that she drinks alcohol. She reports that she does not use drugs.  Current Outpatient Medications on File Prior to Visit  Medication Sig Dispense Refill  . aspirin 81 MG tablet Take 81 mg by mouth daily.    Marland Kitchen buPROPion (WELLBUTRIN SR) 150 MG 12 hr tablet Take 1 tablet (150 mg total) by mouth daily. 90 tablet 1  . cholecalciferol (VITAMIN D) 1000 UNITS tablet Take 1,000 Units by mouth daily.    . clonazePAM (KLONOPIN) 1  MG tablet Take 1 tablet (1 mg total) by mouth at bedtime as needed for anxiety. For sleep due to restless leg syndrome 30 tablet 0  . estradiol (ESTRACE) 0.5 MG tablet TAKE 1 TABLET BY MOUTH DAILY 90 tablet 0  . hydrochlorothiazide (HYDRODIURIL) 25 MG tablet TAKE 1 TABLET BY MOUTH EVERY DAY 90 tablet 0  . KRILL OIL PO Take 1 capsule by mouth daily.    . metoprolol succinate (TOPROL-XL) 50 MG 24 hr tablet TAKE 1 TABLET BY MOUTH DAILY WITH MEAL OR IMMEDIATELY FOLLOWING A MEAL 90 tablet 1  . omeprazole (PRILOSEC) 40 MG capsule TAKE 1 CAPSULE BY MOUTH TWICE DAILY FOR 2 WEEKS, THEN 1 CAPSULE EVERY DAY THEREAFTER 90 capsule 0  . omeprazole (PRILOSEC) 40 MG capsule TAKE 1 CAPSULE BY MOUTH TWICE DAILY FOR 2 WEEKS, THEN 1 CAPSULE EVERY DAY THEREAFTER 106 capsule 0  . OVER THE COUNTER MEDICATION Take 1 tablet by mouth daily. Super k    . potassium chloride SA (K-DUR,KLOR-CON) 20 MEQ tablet TAKE 2 TABLETS BY MOUTH DAILY 180 tablet 3  . Probiotic Product (PROBIOTIC DAILY PO) Take 1 capsule by mouth daily.    . valACYclovir (VALTREX) 1000 MG tablet Take 1 tablet (1,000 mg total) by mouth 2 (two) times daily. 20 tablet 5   No current facility-administered medications on file prior to visit.      Objective:  Objective  Physical Exam  Constitutional: She is oriented to person, place, and time. She appears well-developed and well-nourished.  HENT:  Head: Normocephalic and atraumatic.  Eyes: Conjunctivae  and EOM are normal.  Neck: Normal range of motion. Neck supple. No JVD present. Carotid bruit is not present. No thyromegaly present.  Cardiovascular: Normal rate, regular rhythm and normal heart sounds.  No murmur heard. Pulmonary/Chest: Effort normal and breath sounds normal. No respiratory distress. She has no wheezes. She has no rales. She exhibits no tenderness.  Musculoskeletal: She exhibits edema and tenderness.       Right knee: She exhibits decreased range of motion and swelling. Tenderness found.  Medial joint line tenderness noted.  Neurological: She is alert and oriented to person, place, and time.  Psychiatric: She has a normal mood and affect.  Nursing note and vitals reviewed.  BP (!) 126/53 (BP Location: Right Arm, Cuff Size: Large)   Pulse 70   Temp 98.1 F (36.7 C) (Oral)   Resp 16   Ht 5\' 4"  (1.626 m)   Wt 197 lb 12.8 oz (89.7 kg)   SpO2 98%   BMI 33.95 kg/m  Wt Readings from Last 3 Encounters:  05/25/18 197 lb 12.8 oz (89.7 kg)  11/22/17 198 lb 6.4 oz (90 kg)  07/25/17 195 lb 6.4 oz (88.6 kg)     Lab Results  Component Value Date   WBC 7.8 07/25/2017   HGB 13.1 07/25/2017   HCT 40.2 07/25/2017   PLT 310.0 07/25/2017   GLUCOSE 90 11/02/2017   CHOL 178 11/02/2017   TRIG 121.0 11/02/2017   HDL 61.80 11/02/2017   LDLCALC 92 11/02/2017   ALT 17 11/02/2017   AST 17 11/02/2017   NA 138 11/02/2017   K 3.6 11/02/2017   CL 100 11/02/2017   CREATININE 0.80 12/22/2017   BUN 25 (H) 11/02/2017   CO2 28 11/02/2017   TSH 4.37 11/02/2017    Mr Breast Bilateral W Wo Contrast Inc Cad  Result Date: 12/23/2017 CLINICAL DATA:  Patient with greater than 20% lifetime risk of breast cancer. High risk screening exam. EXAM: BILATERAL BREAST MRI WITH AND WITHOUT CONTRAST TECHNIQUE: Multiplanar, multisequence MR images of both breasts were obtained prior to and following the intravenous administration of 20 ml of MultiHance. THREE-DIMENSIONAL MR IMAGE RENDERING ON INDEPENDENT WORKSTATION: Three-dimensional MR images were rendered by post-processing of the original MR data on an independent workstation. The three-dimensional MR images were interpreted, and findings are reported in the following complete MRI report for this study. Three dimensional images were evaluated at the independent DynaCad workstation COMPARISON:  Previous exam(s). FINDINGS: Breast composition: c. Heterogeneous fibroglandular tissue. Background parenchymal enhancement: Moderate. Right breast: No mass or abnormal  enhancement. Left breast: No mass or abnormal enhancement. Lymph nodes: No abnormal appearing lymph nodes. Ancillary findings:  None. IMPRESSION: No suspicious areas of enhancement identified within either breast. RECOMMENDATION: Continue with annual screening mammography and annual high risk screening breast MRI. BI-RADS CATEGORY  1: Negative. Electronically Signed   By: Lovey Newcomer M.D.   On: 12/23/2017 10:56     Assessment & Plan:  Plan  I am having Grace Vaughn. Grace "[name]" maintain her cholecalciferol, aspirin, KRILL OIL PO, Probiotic Product (PROBIOTIC DAILY PO), OVER THE COUNTER MEDICATION, valACYclovir, potassium chloride SA, omeprazole, metoprolol succinate, estradiol, hydrochlorothiazide, omeprazole, buPROPion, and clonazePAM.  No orders of the defined types were placed in this encounter.   Problem List Items Addressed This Visit    None    Visit Diagnoses    Acute pain of right knee    -  Primary   Relevant Orders   DG Knee Complete 4 Views Right  Ambulatory referral to Sports Medicine    rest , ice  Refer to sport med   Follow-up: Return if symptoms worsen or fail to improve, for annual exam, fasting.  Ann Held, DO

## 2018-05-25 NOTE — Patient Instructions (Signed)

## 2018-05-29 ENCOUNTER — Ambulatory Visit (INDEPENDENT_AMBULATORY_CARE_PROVIDER_SITE_OTHER): Payer: BLUE CROSS/BLUE SHIELD | Admitting: Family Medicine

## 2018-05-29 ENCOUNTER — Encounter: Payer: Self-pay | Admitting: Family Medicine

## 2018-05-29 VITALS — BP 135/77 | HR 64 | Ht 64.0 in | Wt 197.0 lb

## 2018-05-29 DIAGNOSIS — S76311A Strain of muscle, fascia and tendon of the posterior muscle group at thigh level, right thigh, initial encounter: Secondary | ICD-10-CM | POA: Diagnosis not present

## 2018-05-29 NOTE — Progress Notes (Signed)
PCP and consultation requested by: Ann Held, DO  Subjective:   HPI: Patient is a 62 y.o. female here for right knee pain.  Patient states that a week ago she fell down 2 steps.  She does not remember specifically injuring her knee or falling onto it.  She did not have pain initially, but the pain did not become noticeable to the following day.  She localizes her pain to the medial, lateral, posterior aspects of the knee.  Overall she has been having improvement and reports 1/10 pain, soreness.  However, she does note at times it feels like it wants to give out or that her kneecap shifts.  She notes some mild swelling initially which resolved with ice.   Her pain has somewhat been worsened with increased walking.  As a result of her current job she is walking 10-16K steps per day.  She has been using ibuprofen with moderate benefit.  She denies any swelling, erythema, bruising, numbness or tingling, or associated skin changes  Past Medical History:  Diagnosis Date  . Anxiety   . Colon polyps   . GERD (gastroesophageal reflux disease)   . Hypertension   . Insomnia   . Vertigo     Current Outpatient Medications on File Prior to Visit  Medication Sig Dispense Refill  . aspirin 81 MG tablet Take 81 mg by mouth daily.    Marland Kitchen buPROPion (WELLBUTRIN SR) 150 MG 12 hr tablet Take 1 tablet (150 mg total) by mouth daily. 90 tablet 1  . cholecalciferol (VITAMIN D) 1000 UNITS tablet Take 1,000 Units by mouth daily.    . clonazePAM (KLONOPIN) 1 MG tablet Take 1 tablet (1 mg total) by mouth at bedtime as needed for anxiety. For sleep due to restless leg syndrome 30 tablet 0  . estradiol (ESTRACE) 0.5 MG tablet TAKE 1 TABLET BY MOUTH DAILY 90 tablet 0  . hydrochlorothiazide (HYDRODIURIL) 25 MG tablet TAKE 1 TABLET BY MOUTH EVERY DAY 90 tablet 0  . KRILL OIL PO Take 1 capsule by mouth daily.    . metoprolol succinate (TOPROL-XL) 50 MG 24 hr tablet TAKE 1 TABLET BY MOUTH DAILY WITH MEAL OR  IMMEDIATELY FOLLOWING A MEAL 90 tablet 1  . omeprazole (PRILOSEC) 40 MG capsule TAKE 1 CAPSULE BY MOUTH TWICE DAILY FOR 2 WEEKS, THEN 1 CAPSULE EVERY DAY THEREAFTER 90 capsule 0  . omeprazole (PRILOSEC) 40 MG capsule TAKE 1 CAPSULE BY MOUTH TWICE DAILY FOR 2 WEEKS, THEN 1 CAPSULE EVERY DAY THEREAFTER 106 capsule 0  . OVER THE COUNTER MEDICATION Take 1 tablet by mouth daily. Super k    . potassium chloride SA (K-DUR,KLOR-CON) 20 MEQ tablet TAKE 2 TABLETS BY MOUTH DAILY 180 tablet 3  . Probiotic Product (PROBIOTIC DAILY PO) Take 1 capsule by mouth daily.    . valACYclovir (VALTREX) 1000 MG tablet Take 1 tablet (1,000 mg total) by mouth 2 (two) times daily. 20 tablet 5   No current facility-administered medications on file prior to visit.     Past Surgical History:  Procedure Laterality Date  . ABDOMINAL HYSTERECTOMY  01/1999  . [place]  . CHOLECYSTECTOMY    . ROTATOR CUFF REPAIR Right     Allergies  Allergen Reactions  . Augmentin [Amoxicillin-Pot Clavulanate]     ? reaction  . Cephalexin Nausea And Vomiting  . Diovan [Valsartan]   . Erythromycin Nausea And Vomiting  . Lisinopril Cough  . Losartan     Pain in  extremities, paresthesias     Social History   Socioeconomic History  . Marital status: Married    Spouse name: Not on file  . Number of children: 2  . Years of education: BA  . Highest education level: Not on file  Occupational History    Comment: [employer]  Social Needs  . Financial resource strain: Not on file  . Food insecurity:    Worry: Not on file    Inability: Not on file  . Transportation needs:    Medical: Not on file    Non-medical: Not on file  Tobacco Use  . Smoking status: Never Smoker  . Smokeless tobacco: Never Used  Substance and Sexual Activity  . Alcohol use: Yes    Comment: rare  . Drug use: No  . Sexual activity: Yes  Lifestyle  . Physical activity:    Days per week: Not on file    Minutes per  session: Not on file  . Stress: Not on file  Relationships  . Social connections:    Talks on phone: Not on file    Gets together: Not on file    Attends religious service: Not on file    Active member of club or organization: Not on file    Attends meetings of clubs or organizations: Not on file    Relationship status: Not on file  . Intimate partner violence:    Fear of current or ex partner: Not on file    Emotionally abused: Not on file    Physically abused: Not on file    Forced sexual activity: Not on file  Other Topics Concern  . Not on file  Social History Narrative   Exercise--- walking 10,000 steps daily   Drinks 1-2 caffeine drinks a day     Family History  Problem Relation Age of Onset  . Breast cancer Mother   . Heart disease Mother        cardiomyopathy  . Cataracts Mother   . Heart disease Father        CHF  . Cataracts Father     BP 135/77   Pulse 64   Ht 5\' 4"  (1.626 m)   Wt 197 lb (89.4 kg)   BMI 33.81 kg/m   Review of Systems: See HPI above.     Objective:  Physical Exam:  Gen: awake, alert, NAD, comfortable in exam room Pulm: breathing unlabored  Right knee: - Inspection: no gross deformity. No swelling/effusion, erythema or bruising. Skin intact - Palpation: There is tenderness to palpation bilaterally over the biceps femoris and semimembranosus/semitendinosus tendons distally.  There is also tenderness over the proximal medial gastroc - ROM: full active ROM with flexion and extension in knee.   - Strength: 5/5 strength- Pain in the semimembranosus/semitendinosus with resisted knee flexion - Neuro/vasc: NV intact - Special Tests: - LIGAMENTS: negative anterior and posterior drawer, negative Lachman's, no MCL or LCL laxity  -- MENISCUS: negative McMurray's  Left knee: No deformity or swelling No tenderness to palpation Full range of motion with 5/5 strength N/V intact   Assessment & Plan:  1.  Right posterior knee pain - patient's  pain is secondary to mild strain of her hamstrings and gastrocnemius.  Her knee is structurally stable on exam.  Previous knee x-rays were independently reviewed today and show mild degenerative changes. - Heat or ice - NSAIDs as needed for pain - Patient instructed on home exercises - Consider knee sleeve for support - Follow-up  as needed

## 2018-05-29 NOTE — Patient Instructions (Signed)
Your exam is reassuring. You have a mild hamstring and medial gastroc(calf) strain. Icing or heat at this point 15 minutes at a time as needed 3-4 times a day. Start 2 legged calf raises, hamstring curls, and hamstring swings 3 sets of 10 once a day for next 3-4 weeks. Add ankle weight if these become too easy. Activities as tolerated. Aleve 2 tabs twice a day with food OR ibuprofen 600mg  three times a day with food as needed for pain and inflammation. Follow up with me as needed.

## 2018-05-30 ENCOUNTER — Encounter: Payer: Self-pay | Admitting: Family Medicine

## 2018-06-12 ENCOUNTER — Other Ambulatory Visit: Payer: Self-pay | Admitting: Family Medicine

## 2018-06-12 DIAGNOSIS — F32A Depression, unspecified: Secondary | ICD-10-CM

## 2018-06-12 DIAGNOSIS — K219 Gastro-esophageal reflux disease without esophagitis: Secondary | ICD-10-CM

## 2018-06-12 DIAGNOSIS — F329 Major depressive disorder, single episode, unspecified: Secondary | ICD-10-CM

## 2018-06-12 DIAGNOSIS — I10 Essential (primary) hypertension: Secondary | ICD-10-CM

## 2018-06-12 DIAGNOSIS — R232 Flushing: Secondary | ICD-10-CM

## 2018-06-12 MED ORDER — CLONAZEPAM 1 MG PO TABS: 1.0000 mg | ORAL_TABLET | Freq: Every evening | ORAL | 0 refills | Status: DC | PRN

## 2018-06-12 MED ORDER — ESTRADIOL 0.5 MG PO TABS: 0.5000 mg | ORAL_TABLET | Freq: Every day | ORAL | 1 refills | Status: DC

## 2018-06-12 MED ORDER — HYDROCHLOROTHIAZIDE 25 MG PO TABS: 25.0000 mg | ORAL_TABLET | Freq: Every day | ORAL | 1 refills | Status: DC

## 2018-06-12 MED ORDER — OMEPRAZOLE 40 MG PO CPDR: 40.0000 mg | DELAYED_RELEASE_CAPSULE | Freq: Every day | ORAL | 3 refills | Status: DC

## 2018-06-12 NOTE — Telephone Encounter (Signed)
Refill request for clonazepam.   Last OV: 05/25/2018 Last Fill: 05/15/2018 #30 and 0RF UDS: 07/25/2017 Low risk

## 2018-08-07 ENCOUNTER — Other Ambulatory Visit: Payer: Self-pay | Admitting: Family Medicine

## 2018-08-07 DIAGNOSIS — I10 Essential (primary) hypertension: Secondary | ICD-10-CM

## 2018-08-14 ENCOUNTER — Encounter: Payer: BLUE CROSS/BLUE SHIELD | Admitting: Family Medicine

## 2018-08-14 DIAGNOSIS — Z0289 Encounter for other administrative examinations: Secondary | ICD-10-CM

## 2018-08-18 ENCOUNTER — Encounter: Payer: Self-pay | Admitting: Family Medicine

## 2018-08-29 ENCOUNTER — Other Ambulatory Visit: Payer: Self-pay | Admitting: Family Medicine

## 2018-08-29 DIAGNOSIS — F32A Depression, unspecified: Secondary | ICD-10-CM

## 2018-08-29 DIAGNOSIS — F329 Major depressive disorder, single episode, unspecified: Secondary | ICD-10-CM

## 2018-08-30 MED ORDER — CLONAZEPAM 1 MG PO TABS: 1.0000 mg | ORAL_TABLET | Freq: Every evening | ORAL | 0 refills | Status: DC | PRN

## 2018-08-30 NOTE — Telephone Encounter (Signed)
Last written: 06/12/18 Last ov: 05/25/18 Next ov: 10/02/18  Contract: will get at 3/16 visit UDS: will get at 3/16 visit

## 2018-10-02 ENCOUNTER — Other Ambulatory Visit: Payer: Self-pay

## 2018-10-02 ENCOUNTER — Ambulatory Visit (INDEPENDENT_AMBULATORY_CARE_PROVIDER_SITE_OTHER): Payer: BLUE CROSS/BLUE SHIELD | Admitting: Family Medicine

## 2018-10-02 ENCOUNTER — Encounter: Payer: Self-pay | Admitting: Family Medicine

## 2018-10-02 ENCOUNTER — Other Ambulatory Visit (HOSPITAL_BASED_OUTPATIENT_CLINIC_OR_DEPARTMENT_OTHER): Payer: Self-pay | Admitting: Family Medicine

## 2018-10-02 VITALS — BP 111/69 | HR 71 | Temp 98.5°F | Resp 16 | Ht 63.5 in | Wt 182.2 lb

## 2018-10-02 DIAGNOSIS — F418 Other specified anxiety disorders: Secondary | ICD-10-CM | POA: Diagnosis not present

## 2018-10-02 DIAGNOSIS — F419 Anxiety disorder, unspecified: Secondary | ICD-10-CM

## 2018-10-02 DIAGNOSIS — E6609 Other obesity due to excess calories: Secondary | ICD-10-CM

## 2018-10-02 DIAGNOSIS — I10 Essential (primary) hypertension: Secondary | ICD-10-CM | POA: Diagnosis not present

## 2018-10-02 DIAGNOSIS — Z0000 Encounter for general adult medical examination without abnormal findings: Secondary | ICD-10-CM | POA: Diagnosis not present

## 2018-10-02 DIAGNOSIS — Z1231 Encounter for screening mammogram for malignant neoplasm of breast: Secondary | ICD-10-CM

## 2018-10-02 DIAGNOSIS — E785 Hyperlipidemia, unspecified: Secondary | ICD-10-CM

## 2018-10-02 MED ORDER — CLONAZEPAM 1 MG PO TABS: 1.0000 mg | ORAL_TABLET | Freq: Every evening | ORAL | 0 refills | Status: DC | PRN

## 2018-10-02 NOTE — Assessment & Plan Note (Signed)
Stable con't meds 

## 2018-10-02 NOTE — Progress Notes (Signed)
Subjective:     Grace Vaughn is a 63 y.o. female and is here for a comprehensive physical exam. The patient reports no problems.  Social History   Socioeconomic History  . Marital status: Married    Spouse name: Not on file  . Number of children: 2  . Years of education: BA  . Highest education level: Not on file  Occupational History    Comment: [employer]  Social Needs  . Financial resource strain: Not on file  . Food insecurity:    Worry: Not on file    Inability: Not on file  . Transportation needs:    Medical: Not on file    Non-medical: Not on file  Tobacco Use  . Smoking status: Never Smoker  . Smokeless tobacco: Never Used  Substance and Sexual Activity  . Alcohol use: Yes    Comment: rare  . Drug use: No  . Sexual activity: Yes  Lifestyle  . Physical activity:    Days per week: Not on file    Minutes per session: Not on file  . Stress: Not on file  Relationships  . Social connections:    Talks on phone: Not on file    Gets together: Not on file    Attends religious service: Not on file    Active member of club or organization: Not on file    Attends meetings of clubs or organizations: Not on file    Relationship status: Not on file  . Intimate partner violence:    Fear of current or ex partner: Not on file    Emotionally abused: Not on file    Physically abused: Not on file    Forced sexual activity: Not on file  Other Topics Concern  . Not on file  Social History Narrative   Exercise--- walking 10,000 steps daily   Drinks 1-2 caffeine drinks a day    Health Maintenance  Topic Date Due  . MAMMOGRAM  08/22/2018  . COLONOSCOPY  07/18/2020  . TETANUS/TDAP  05/05/2025  . INFLUENZA VACCINE  Completed  . Hepatitis C Screening  Completed  . HIV Screening  Completed  . PAP SMEAR-Modifier  Discontinued    The following portions of the patient's history were reviewed and updated as appropriate:  She  has a past medical history of  Anxiety, Colon polyps, GERD (gastroesophageal reflux disease), Hypertension, Insomnia, and Vertigo. She does not have any pertinent problems on file. She  has a past surgical history that includes Cesarean section (1986, 1989); Abdominal hysterectomy (01/1999); Cholecystectomy; and Rotator cuff repair (Right). Her family history includes Breast cancer in her mother, sister, and sister; Cataracts in her father and mother; Heart disease in her father and mother. She  reports that she has never smoked. She has never used smokeless tobacco. She reports current alcohol use. She reports that she does not use drugs. She has a current medication list which includes the following prescription(s): aspirin, bupropion, cholecalciferol, clonazepam, estradiol, hydrochlorothiazide, krill oil, metoprolol succinate, omeprazole, OVER THE COUNTER MEDICATION, potassium chloride sa, probiotic product, and valacyclovir. Current Outpatient Medications on File Prior to Visit  Medication Sig Dispense Refill  . aspirin 81 MG tablet Take 81 mg by mouth daily.    Marland Kitchen buPROPion (WELLBUTRIN SR) 150 MG 12 hr tablet Take 1 tablet (150 mg total) by mouth daily. 90 tablet 1  . cholecalciferol (VITAMIN D) 1000 UNITS tablet Take 1,000 Units by mouth daily.    Marland Kitchen estradiol (ESTRACE) 0.5 MG  tablet Take 1 tablet (0.5 mg total) by mouth daily. 90 tablet 1  . hydrochlorothiazide (HYDRODIURIL) 25 MG tablet Take 1 tablet (25 mg total) by mouth daily. 90 tablet 1  . KRILL OIL PO Take 1 capsule by mouth daily.    . metoprolol succinate (TOPROL-XL) 50 MG 24 hr tablet TAKE 1 TABLET BY MOUTH EVERY DAY WITH A MEAL OR IMMEDIATELY FOLLOWING A MEAL 90 tablet 1  . omeprazole (PRILOSEC) 40 MG capsule Take 1 capsule (40 mg total) by mouth daily. 90 capsule 3  . OVER THE COUNTER MEDICATION Take 1 tablet by mouth daily. Super k    . potassium chloride SA (K-DUR,KLOR-CON) 20 MEQ tablet TAKE 2 TABLETS BY MOUTH DAILY 180 tablet 3  . Probiotic Product (PROBIOTIC  DAILY PO) Take 1 capsule by mouth daily.    . valACYclovir (VALTREX) 1000 MG tablet Take 1 tablet (1,000 mg total) by mouth 2 (two) times daily. 20 tablet 5   No current facility-administered medications on file prior to visit.    She is allergic to augmentin [amoxicillin-pot clavulanate]; cephalexin; diovan [valsartan]; erythromycin; lisinopril; and losartan..  Review of Systems Review of Systems  Constitutional: Negative for activity change, appetite change and fatigue.  HENT: Negative for hearing loss, congestion, tinnitus and ear discharge.  dentist q38m Eyes: Negative for visual disturbance (see optho q1y -- vision corrected to 20/20 with glasses).  Respiratory: Negative for cough, chest tightness and shortness of breath.   Cardiovascular: Negative for chest pain, palpitations and leg swelling.  Gastrointestinal: Negative for abdominal pain, diarrhea, constipation and abdominal distention.  Genitourinary: Negative for urgency, frequency, decreased urine volume and difficulty urinating.  Musculoskeletal: Negative for back pain, arthralgias and gait problem.  Skin: Negative for color change, pallor and rash.  Neurological: Negative for dizziness, light-headedness, numbness and headaches.  Hematological: Negative for adenopathy. Does not bruise/bleed easily.  Psychiatric/Behavioral: Negative for suicidal ideas, confusion, sleep disturbance, self-injury, dysphoric mood, decreased concentration and agitation.       Objective:    BP 111/69 (BP Location: Left Arm, Cuff Size: Large)   Pulse 71   Temp 98.5 F (36.9 C) (Oral)   Resp 16   Ht 5' 3.5" (1.613 m)   Wt 182 lb 3.2 oz (82.6 kg)   SpO2 98%   BMI 31.77 kg/m  General appearance: alert, cooperative, appears stated age and no distress Head: Normocephalic, without obvious abnormality, atraumatic Eyes: conjunctivae/corneas clear. PERRL, EOM's intact. Fundi benign. Ears: normal TM's and external ear canals both ears Nose: Nares  normal. Septum midline. Mucosa normal. No drainage or sinus tenderness. Throat: lips, mucosa, and tongue normal; teeth and gums normal Neck: no adenopathy, no carotid bruit, no JVD, supple, symmetrical, trachea midline and thyroid not enlarged, symmetric, no tenderness/mass/nodules Back: symmetric, no curvature. ROM normal. No CVA tenderness. Lungs: clear to auscultation bilaterally Breasts: normal appearance, no masses or tenderness Heart: regular rate and rhythm, S1, S2 normal, no murmur, click, rub or gallop Abdomen: soft, non-tender; bowel sounds normal; no masses,  no organomegaly Pelvic: not indicated; status post hysterectomy, negative ROS Extremities: extremities normal, atraumatic, no cyanosis or edema Pulses: 2+ and symmetric Skin: Skin color, texture, turgor normal. No rashes or lesions Lymph nodes: Cervical, supraclavicular, and axillary nodes normal. Neurologic: Alert and oriented X 3, normal strength and tone. Normal symmetric reflexes. Normal coordination and gait    Assessment:    Healthy female exam.      Plan:    ghm utd Check labs  See After Visit  Summary for Counseling Recommendations    1. Preventative health care See above - TSH - CBC with Differential/Platelet - Lipid panel - Comprehensive metabolic panel  2. Depression, unspecified depression type  - clonazePAM (KLONOPIN) 1 MG tablet; Take 1 tablet (1 mg total) by mouth at bedtime as needed for anxiety. For sleep due to restless leg syndrome  Dispense: 30 tablet; Refill: 0  3. Anxiety Stable , con't meds   4. Essential hypertension Well controlled, no changes to meds. Encouraged heart healthy diet such as the DASH diet and exercise as tolerated.   5. Hyperlipidemia, unspecified hyperlipidemia type .Encouraged heart healthy diet, increase exercise, avoid trans fats, consider a krill oil cap daily

## 2018-10-02 NOTE — Assessment & Plan Note (Signed)
Well controlled, no changes to meds. Encouraged heart healthy diet such as the DASH diet and exercise as tolerated.  °

## 2018-10-02 NOTE — Assessment & Plan Note (Addendum)
Doing well with diet and exercise

## 2018-10-02 NOTE — Assessment & Plan Note (Signed)
Tolerating statin, encouraged heart healthy diet, avoid trans fats, minimize simple carbs and saturated fats. Increase exercise as tolerated 

## 2018-10-02 NOTE — Patient Instructions (Signed)
Preventive Care 40-64 Years, Female Preventive care refers to lifestyle choices and visits with your health care provider that can promote health and wellness. What does preventive care include?   A yearly physical exam. This is also called an annual well check.  Dental exams once or twice a year.  Routine eye exams. Ask your health care provider how often you should have your eyes checked.  Personal lifestyle choices, including: ? Daily care of your teeth and gums. ? Regular physical activity. ? Eating a healthy diet. ? Avoiding tobacco and drug use. ? Limiting alcohol use. ? Practicing safe sex. ? Taking low-dose aspirin daily starting at age 50. ? Taking vitamin and mineral supplements as recommended by your health care provider. What happens during an annual well check? The services and screenings done by your health care provider during your annual well check will depend on your age, overall health, lifestyle risk factors, and family history of disease. Counseling Your health care provider may ask you questions about your:  Alcohol use.  Tobacco use.  Drug use.  Emotional well-being.  Home and relationship well-being.  Sexual activity.  Eating habits.  Work and work environment.  Method of birth control.  Menstrual cycle.  Pregnancy history. Screening You may have the following tests or measurements:  Height, weight, and BMI.  Blood pressure.  Lipid and cholesterol levels. These may be checked every 5 years, or more frequently if you are over 50 years old.  Skin check.  Lung cancer screening. You may have this screening every year starting at age 55 if you have a 30-pack-year history of smoking and currently smoke or have quit within the past 15 years.  Colorectal cancer screening. All adults should have this screening starting at age 50 and continuing until age 75. Your health care provider may recommend screening at age 45. You will have tests every  1-10 years, depending on your results and the type of screening test. People at increased risk should start screening at an earlier age. Screening tests may include: ? Guaiac-based fecal occult blood testing. ? Fecal immunochemical test (FIT). ? Stool DNA test. ? Virtual colonoscopy. ? Sigmoidoscopy. During this test, a flexible tube with a tiny camera (sigmoidoscope) is used to examine your rectum and lower colon. The sigmoidoscope is inserted through your anus into your rectum and lower colon. ? Colonoscopy. During this test, a long, thin, flexible tube with a tiny camera (colonoscope) is used to examine your entire colon and rectum.  Hepatitis C blood test.  Hepatitis B blood test.  Sexually transmitted disease (STD) testing.  Diabetes screening. This is done by checking your blood sugar (glucose) after you have not eaten for a while (fasting). You may have this done every 1-3 years.  Mammogram. This may be done every 1-2 years. Talk to your health care provider about when you should start having regular mammograms. This may depend on whether you have a family history of breast cancer.  BRCA-related cancer screening. This may be done if you have a family history of breast, ovarian, tubal, or peritoneal cancers.  Pelvic exam and Pap test. This may be done every 3 years starting at age 21. Starting at age 30, this may be done every 5 years if you have a Pap test in combination with an HPV test.  Bone density scan. This is done to screen for osteoporosis. You may have this scan if you are at high risk for osteoporosis. Discuss your test results, treatment options,   and if necessary, the need for more tests with your health care provider. Vaccines Your health care provider may recommend certain vaccines, such as:  Influenza vaccine. This is recommended every year.  Tetanus, diphtheria, and acellular pertussis (Tdap, Td) vaccine. You may need a Td booster every 10 years.  Varicella  vaccine. You may need this if you have not been vaccinated.  Zoster vaccine. You may need this after age 38.  Measles, mumps, and rubella (MMR) vaccine. You may need at least one dose of MMR if you were born in 1957 or later. You may also need a second dose.  Pneumococcal 13-valent conjugate (PCV13) vaccine. You may need this if you have certain conditions and were not previously vaccinated.  Pneumococcal polysaccharide (PPSV23) vaccine. You may need one or two doses if you smoke cigarettes or if you have certain conditions.  Meningococcal vaccine. You may need this if you have certain conditions.  Hepatitis A vaccine. You may need this if you have certain conditions or if you travel or work in places where you may be exposed to hepatitis A.  Hepatitis B vaccine. You may need this if you have certain conditions or if you travel or work in places where you may be exposed to hepatitis B.  Haemophilus influenzae type b (Hib) vaccine. You may need this if you have certain conditions. Talk to your health care provider about which screenings and vaccines you need and how often you need them. This information is not intended to replace advice given to you by your health care provider. Make sure you discuss any questions you have with your health care provider. Document Released: 08/01/2015 Document Revised: 08/25/2017 Document Reviewed: 05/06/2015 Elsevier Interactive Patient Education  2019 [employer].

## 2018-10-03 ENCOUNTER — Ambulatory Visit (HOSPITAL_BASED_OUTPATIENT_CLINIC_OR_DEPARTMENT_OTHER)
Admission: RE | Admit: 2018-10-03 | Discharge: 2018-10-03 | Disposition: A | Discharge status: Home / Self Care | Payer: BLUE CROSS/BLUE SHIELD | Source: Ambulatory Visit | Attending: Family Medicine | Admitting: Family Medicine

## 2018-10-03 ENCOUNTER — Encounter (HOSPITAL_BASED_OUTPATIENT_CLINIC_OR_DEPARTMENT_OTHER): Payer: Self-pay

## 2018-10-03 DIAGNOSIS — Z1231 Encounter for screening mammogram for malignant neoplasm of breast: Secondary | ICD-10-CM | POA: Insufficient documentation

## 2018-10-03 LAB — CBC WITH DIFFERENTIAL/PLATELET
BASOS ABS: 0.1 10*3/uL (ref 0.0–0.1)
Basophils Relative: 1.3 % (ref 0.0–3.0)
EOS PCT: 0.4 % (ref 0.0–5.0)
Eosinophils Absolute: 0 10*3/uL (ref 0.0–0.7)
HCT: 40.5 % (ref 36.0–46.0)
HEMOGLOBIN: 13.7 g/dL (ref 12.0–15.0)
Lymphocytes Relative: 28.1 % (ref 12.0–46.0)
Lymphs Abs: 2.5 10*3/uL (ref 0.7–4.0)
MCHC: 33.8 g/dL (ref 30.0–36.0)
MCV: 89.3 fl (ref 78.0–100.0)
MONOS PCT: 6.9 % (ref 3.0–12.0)
Monocytes Absolute: 0.6 10*3/uL (ref 0.1–1.0)
Neutro Abs: 5.7 10*3/uL (ref 1.4–7.7)
Neutrophils Relative %: 63.3 % (ref 43.0–77.0)
Platelets: 313 10*3/uL (ref 150.0–400.0)
RBC: 4.53 Mil/uL (ref 3.87–5.11)
RDW: 13 % (ref 11.5–15.5)
WBC: 9 10*3/uL (ref 4.0–10.5)

## 2018-10-03 LAB — COMPREHENSIVE METABOLIC PANEL
ALBUMIN: 4.3 g/dL (ref 3.5–5.2)
ALK PHOS: 65 U/L (ref 39–117)
ALT: 16 U/L (ref 0–35)
AST: 19 U/L (ref 0–37)
BUN: 25 mg/dL — ABNORMAL HIGH (ref 6–23)
CALCIUM: 10 mg/dL (ref 8.4–10.5)
CO2: 29 mEq/L (ref 19–32)
Chloride: 101 mEq/L (ref 96–112)
Creatinine, Ser: 0.84 mg/dL (ref 0.40–1.20)
GFR: 68.7 mL/min (ref >60.00)
Glucose, Bld: 107 mg/dL — ABNORMAL HIGH (ref 70–99)
POTASSIUM: 4.5 meq/L (ref 3.5–5.1)
SODIUM: 138 meq/L (ref 135–145)
Total Bilirubin: 0.3 mg/dL (ref 0.2–1.2)
Total Protein: 7 g/dL (ref 6.0–8.3)

## 2018-10-03 LAB — LIPID PANEL
CHOLESTEROL: 207 mg/dL — AB (ref 0–200)
HDL: 62.5 mg/dL (ref >39.00)
LDL CALC: 118 mg/dL — AB (ref 0–99)
NonHDL: 144.32
Total CHOL/HDL Ratio: 3
Triglycerides: 132 mg/dL (ref 0.0–149.0)
VLDL: 26.4 mg/dL (ref 0.0–40.0)

## 2018-10-03 LAB — TSH: TSH: 2.93 u[IU]/mL (ref 0.35–4.50)

## 2018-10-12 ENCOUNTER — Other Ambulatory Visit: Payer: Self-pay | Admitting: Family Medicine

## 2018-11-12 ENCOUNTER — Other Ambulatory Visit: Payer: Self-pay | Admitting: Family Medicine

## 2018-11-12 DIAGNOSIS — F418 Other specified anxiety disorders: Secondary | ICD-10-CM

## 2018-11-13 MED ORDER — CLONAZEPAM 1 MG PO TABS: 1.0000 mg | ORAL_TABLET | Freq: Every evening | ORAL | 0 refills | Status: DC | PRN

## 2018-11-13 NOTE — Telephone Encounter (Signed)
Requesting: Klonopin Contract: 07/25/2017 UDS: 07/25/2017, Low risk Last OV: 10/02/2018 Next OV: 04/05/2019 Last Refill: 10/02/2018, #30--0 RF Database:   Please advise

## 2018-11-24 ENCOUNTER — Ambulatory Visit: Payer: Self-pay

## 2018-11-24 ENCOUNTER — Telehealth: Payer: Self-pay | Admitting: Family Medicine

## 2018-11-24 NOTE — Telephone Encounter (Signed)
On May 4th pat states that a repair man came into her home. She got a call today stating the repair man tested Positive for Covid19. She is not showing any signs as of now. Didn't really need Vit visit. I told her I would document the call in her chart.

## 2018-11-24 NOTE — Telephone Encounter (Signed)
Patient called and says she was exposed to someone who tested positive for COVID-19. She says he was putting in cabinets at her house on Monday, 11/20/18, was in the one room for 1 hour. She says her husband received word that this person was tested and is positive. She says she's not having any symptoms, no travel and she wants to know if she needs to be tested or what to do. I called the office and spoke to [place], [facility] who asks to speak to the the patient, the call was connected successfully.  Answer Assessment - Initial Assessment Questions 1. CLOSE CONTACT: "Who is the person with the confirmed or suspected COVID-19 infection that you were exposed to?"     A person who worked on cabinets 2. PLACE of CONTACT: "Where were you when you were exposed to COVID-19?" (e.g., home, school, medical waiting room; which city?)     home 3. TYPE of CONTACT: "How much contact was there?" (e.g., sitting next to, live in same house, work in same office, same building)     In my house  4. DURATION of CONTACT: "How long were you in contact with the COVID-19 patient?" (e.g., a few seconds, passed by person, a few minutes, live with the patient)     1 hour 5. DATE of CONTACT: "When did you have contact with a COVID-19 patient?" (e.g., how many days ago)     11/20/18 6. TRAVEL: "Have you traveled out of the country recently?" If so, "When and where?"     * Also ask about out-of-state travel, since the CDC has identified some high risk cities for community spread in the [place].     * Note: Travel becomes less relevant if there is widespread community transmission where the patient lives.     No 7. COMMUNITY SPREAD: "Are there lots of cases of COVID-19 (community spread) where you live?" (See public health department website, if unsure)   * MAJOR community spread: high number of cases; numbers of cases are increasing; many people hospitalized.   * MINOR community spread: low number of cases; not increasing; few or no people  hospitalized     No 8. SYMPTOMS: "Do you have any symptoms?" (e.g., fever, cough, breathing difficulty)     No 9. PREGNANCY OR POSTPARTUM: "Is there any chance you are pregnant?" "When was your last menstrual period?" "Did you deliver in the last 2 weeks?"     No 10. HIGH RISK: "Do you have any heart or lung problems? Do you have a weak immune system?" (e.g., CHF, COPD, asthma, HIV positive, chemotherapy, renal failure, diabetes mellitus, sickle cell anemia)      No  Protocols used: CORONAVIRUS (COVID-19) EXPOSURE-A-AH

## 2018-11-27 NOTE — Telephone Encounter (Signed)
Give her the info for [facility] testing------ if she has no symptoms she does not need to be tested but she does need to self quarantine --- sheketia has the [employer]

## 2018-11-27 NOTE — Telephone Encounter (Signed)
Follow up call made to patient. Information given to patient per Dr. Carollee Herter on testing site at [facility].

## 2018-11-28 ENCOUNTER — Other Ambulatory Visit: Payer: Self-pay | Admitting: Family Medicine

## 2019-01-01 ENCOUNTER — Other Ambulatory Visit: Payer: Self-pay | Admitting: Family Medicine

## 2019-01-01 DIAGNOSIS — F418 Other specified anxiety disorders: Secondary | ICD-10-CM

## 2019-01-01 MED ORDER — CLONAZEPAM 1 MG PO TABS: 1.0000 mg | ORAL_TABLET | Freq: Every evening | ORAL | 0 refills | Status: DC | PRN

## 2019-01-01 MED ORDER — BUPROPION HCL ER (SR) 150 MG PO TB12: 150.0000 mg | ORAL_TABLET | Freq: Every day | ORAL | 3 refills | Status: DC

## 2019-01-01 NOTE — Telephone Encounter (Signed)
Requesting: Klonopin Contract: 07/25/2017 UDS: 07/25/2017, low risk, next screen 01/2018 Last OV: 10/02/2018 Next OV: 04/05/2019 Last Refill: 11/13/2018, #30--0 RF Database:   Please advise

## 2019-01-06 DIAGNOSIS — Z03818 Encounter for observation for suspected exposure to other biological agents ruled out: Secondary | ICD-10-CM | POA: Diagnosis not present

## 2019-01-10 DIAGNOSIS — L719 Rosacea, unspecified: Secondary | ICD-10-CM | POA: Diagnosis not present

## 2019-01-10 DIAGNOSIS — I781 Nevus, non-neoplastic: Secondary | ICD-10-CM | POA: Diagnosis not present

## 2019-01-10 DIAGNOSIS — L821 Other seborrheic keratosis: Secondary | ICD-10-CM | POA: Diagnosis not present

## 2019-01-10 DIAGNOSIS — Z85820 Personal history of malignant melanoma of skin: Secondary | ICD-10-CM | POA: Diagnosis not present

## 2019-01-31 ENCOUNTER — Other Ambulatory Visit: Payer: Self-pay | Admitting: Family Medicine

## 2019-01-31 DIAGNOSIS — I10 Essential (primary) hypertension: Secondary | ICD-10-CM

## 2019-01-31 DIAGNOSIS — F418 Other specified anxiety disorders: Secondary | ICD-10-CM

## 2019-01-31 MED ORDER — CLONAZEPAM 1 MG PO TABS: 1.0000 mg | ORAL_TABLET | Freq: Every evening | ORAL | 0 refills | Status: DC | PRN

## 2019-01-31 NOTE — Telephone Encounter (Signed)
Clonazepam refill.   Last OV: 10/02/2018 Last Fill: 01/01/2019 #30 and 0RF UDS: 07/25/2017 Low risk

## 2019-03-07 ENCOUNTER — Other Ambulatory Visit: Payer: Self-pay | Admitting: Family Medicine

## 2019-03-07 DIAGNOSIS — I10 Essential (primary) hypertension: Secondary | ICD-10-CM

## 2019-03-07 DIAGNOSIS — R232 Flushing: Secondary | ICD-10-CM

## 2019-04-04 ENCOUNTER — Other Ambulatory Visit: Payer: Self-pay

## 2019-04-05 ENCOUNTER — Encounter: Payer: Self-pay | Admitting: Family Medicine

## 2019-04-05 ENCOUNTER — Ambulatory Visit (INDEPENDENT_AMBULATORY_CARE_PROVIDER_SITE_OTHER): Payer: 59 | Admitting: Family Medicine

## 2019-04-05 VITALS — BP 120/74 | HR 60 | Temp 97.3°F | Resp 18 | Ht 63.5 in | Wt 184.6 lb

## 2019-04-05 DIAGNOSIS — Z23 Encounter for immunization: Secondary | ICD-10-CM | POA: Diagnosis not present

## 2019-04-05 DIAGNOSIS — I10 Essential (primary) hypertension: Secondary | ICD-10-CM

## 2019-04-05 DIAGNOSIS — F419 Anxiety disorder, unspecified: Secondary | ICD-10-CM | POA: Diagnosis not present

## 2019-04-05 NOTE — Assessment & Plan Note (Signed)
Well controlled, no changes to meds. Encouraged heart healthy diet such as the DASH diet and exercise as tolerated.  °

## 2019-04-05 NOTE — Progress Notes (Signed)
Patient ID: Grace Vaughn, female    DOB: 09/16/1955  Age: 63 y.o. MRN: [id number]    Subjective:  Subjective  HPI Grace Vaughn presents for f/u bp and anxiety.  No complaints She would also like her flu shot  Review of Systems  Constitutional: Negative for appetite change, diaphoresis, fatigue and unexpected weight change.  Eyes: Negative for pain, redness and visual disturbance.  Respiratory: Negative for cough, chest tightness, shortness of breath and wheezing.   Cardiovascular: Negative for chest pain, palpitations and leg swelling.  Endocrine: Negative for cold intolerance, heat intolerance, polydipsia, polyphagia and polyuria.  Genitourinary: Negative for difficulty urinating, dysuria and frequency.  Neurological: Negative for dizziness, light-headedness, numbness and headaches.    History Past Medical History:  Diagnosis Date  . Anxiety   . Colon polyps   . GERD (gastroesophageal reflux disease)   . Hypertension   . Insomnia   . Vertigo     She has a past surgical history that includes Cesarean section (1986, 1989); Abdominal hysterectomy (01/1999); Cholecystectomy; and Rotator cuff repair (Right).   Her family history includes Breast cancer in her mother, sister, and sister; Cataracts in her father and mother; Heart disease in her father and mother.She reports that she has never smoked. She has never used smokeless tobacco. She reports current alcohol use. She reports that she does not use drugs.  Current Outpatient Medications on File Prior to Visit  Medication Sig Dispense Refill  . aspirin 81 MG tablet Take 81 mg by mouth daily.    Marland Kitchen buPROPion (WELLBUTRIN SR) 150 MG 12 hr tablet Take 1 tablet (150 mg total) by mouth daily. 90 tablet 3  . cholecalciferol (VITAMIN D) 1000 UNITS tablet Take 1,000 Units by mouth daily.    . clonazePAM (KLONOPIN) 1 MG tablet Take 1 tablet (1 mg total) by mouth at bedtime as needed for anxiety. For sleep due to restless leg syndrome  30 tablet 0  . estradiol (ESTRACE) 0.5 MG tablet TAKE 1 TABLET(0.5 MG) BY MOUTH DAILY 90 tablet 1  . hydrochlorothiazide (HYDRODIURIL) 25 MG tablet TAKE 1 TABLET(25 MG) BY MOUTH DAILY 90 tablet 1  . KRILL OIL PO Take 1 capsule by mouth daily.    . metoprolol succinate (TOPROL-XL) 50 MG 24 hr tablet TAKE 1 TABLET BY MOUTH EVERY DAY WITH A MEAL OR IMMEDIATELY FOLLOWING A MEAL 90 tablet 1  . omeprazole (PRILOSEC) 40 MG capsule Take 1 capsule (40 mg total) by mouth daily. 90 capsule 3  . OVER THE COUNTER MEDICATION Take 1 tablet by mouth daily. Super k    . potassium chloride SA (K-DUR,KLOR-CON) 20 MEQ tablet TAKE 2 TABLETS BY MOUTH EVERY DAY 180 tablet 3  . Probiotic Product (PROBIOTIC DAILY PO) Take 1 capsule by mouth daily.    . valACYclovir (VALTREX) 1000 MG tablet Take 1 tablet (1,000 mg total) by mouth 2 (two) times daily. 20 tablet 5   No current facility-administered medications on file prior to visit.      Objective:  Objective  Physical Exam Vitals signs and nursing note reviewed.  Constitutional:      Appearance: She is well-developed.  HENT:     Head: Normocephalic and atraumatic.  Eyes:     Conjunctiva/sclera: Conjunctivae normal.  Neck:     Musculoskeletal: Normal range of motion and neck supple.     Thyroid: No thyromegaly.     Vascular: No carotid bruit or JVD.  Cardiovascular:     Rate and Rhythm: Normal rate  and regular rhythm.     Heart sounds: Normal heart sounds. No murmur.  Pulmonary:     Effort: Pulmonary effort is normal. No respiratory distress.     Breath sounds: Normal breath sounds. No wheezing or rales.  Chest:     Chest wall: No tenderness.  Neurological:     Mental Status: She is alert and oriented to person, place, and time.    BP 120/74 (BP Location: Right Arm, Patient Position: Sitting, Cuff Size: Normal)   Pulse 60   Temp (!) 97.3 F (36.3 C) (Temporal)   Resp 18   Ht 5' 3.5" (1.613 m)   Wt 184 lb 9.6 oz (83.7 kg)   SpO2 100%   BMI 32.19  kg/m  Wt Readings from Last 3 Encounters:  04/05/19 184 lb 9.6 oz (83.7 kg)  10/02/18 182 lb 3.2 oz (82.6 kg)  05/29/18 197 lb (89.4 kg)     Lab Results  Component Value Date   WBC 9.0 10/02/2018   HGB 13.7 10/02/2018   HCT 40.5 10/02/2018   PLT 313.0 10/02/2018   GLUCOSE 107 (H) 10/02/2018   CHOL 207 (H) 10/02/2018   TRIG 132.0 10/02/2018   HDL 62.50 10/02/2018   LDLCALC 118 (H) 10/02/2018   ALT 16 10/02/2018   AST 19 10/02/2018   NA 138 10/02/2018   K 4.5 10/02/2018   CL 101 10/02/2018   CREATININE 0.84 10/02/2018   BUN 25 (H) 10/02/2018   CO2 29 10/02/2018   TSH 2.93 10/02/2018    Mm 3d Screen Breast Bilateral  Result Date: 10/03/2018 CLINICAL DATA:  Screening. Strong family history of breast cancer, including in patient's mother as well as 2 sisters. EXAM: DIGITAL SCREENING BILATERAL MAMMOGRAM WITH TOMO AND CAD COMPARISON:  Previous exam(s). ACR Breast Density Category c: The breast tissue is heterogeneously dense, which may obscure small masses. FINDINGS: There are no findings suspicious for malignancy. Images were processed with CAD. IMPRESSION: No mammographic evidence of malignancy. A result letter of this screening mammogram will be mailed directly to the patient. RECOMMENDATION: 1.  Screening mammogram in one year.(Code:[id number]) 2. Annual screening mammography as well as breast MRI is recommended given patient's strong family history of breast cancer and greater than 20% lifetime risk of developing breast cancer. BI-RADS CATEGORY  1: Negative. Electronically Signed   By: Everlean Alstrom M.D.   On: 10/03/2018 15:08     Assessment & Plan:  Plan  I am having Grace Boston. Vaughn "Grace Vaughn" maintain her cholecalciferol, aspirin, KRILL OIL PO, Probiotic Product (PROBIOTIC DAILY PO), OVER THE COUNTER MEDICATION, valACYclovir, omeprazole, potassium chloride SA, buPROPion, hydrochlorothiazide, clonazePAM, estradiol, and metoprolol succinate.  No orders of the defined types were  placed in this encounter.   Problem List Items Addressed This Visit      Unprioritized   Anxiety    Well controlled, no changes to meds. Encouraged heart healthy diet such as the DASH diet and exercise as tolerated.       Essential hypertension    Well controlled, no changes to meds. Encouraged heart healthy diet such as the DASH diet and exercise as tolerated.          Other Visit Diagnoses    Need for influenza vaccination    -  Primary   Relevant Orders   Flu Vaccine QUAD 36+ mos IM      Follow-up: Return in about 6 months (around 10/03/2019), or if symptoms worsen or fail to improve, for annual exam, fasting.  Kendrick Fries  [place]ales, DO

## 2019-04-05 NOTE — Patient Instructions (Signed)

## 2019-04-08 ENCOUNTER — Other Ambulatory Visit: Payer: Self-pay | Admitting: Family Medicine

## 2019-04-08 DIAGNOSIS — F418 Other specified anxiety disorders: Secondary | ICD-10-CM

## 2019-04-09 MED ORDER — CLONAZEPAM 1 MG PO TABS: 1.0000 mg | ORAL_TABLET | Freq: Every evening | ORAL | 0 refills | Status: DC | PRN

## 2019-04-09 NOTE — Telephone Encounter (Signed)
Clonazepam refill.   Last OV: 10/02/2018 Last Fill: 01/31/2019 #30 and 0RF UDS: 07/25/2017 Low risk

## 2019-05-07 ENCOUNTER — Other Ambulatory Visit: Payer: Self-pay | Admitting: Family Medicine

## 2019-05-07 DIAGNOSIS — F418 Other specified anxiety disorders: Secondary | ICD-10-CM

## 2019-05-08 MED ORDER — CLONAZEPAM 1 MG PO TABS: 1.0000 mg | ORAL_TABLET | Freq: Every evening | ORAL | 0 refills | Status: DC | PRN

## 2019-05-08 NOTE — Telephone Encounter (Signed)
Requesting: Klonopin Contract: 07/25/2017 UDS: 07/25/2017 Last OV: 04/05/2019 Next OV: 10/11/2019 Last Refill: 04/09/2019 Database:   Please advise

## 2019-06-21 ENCOUNTER — Other Ambulatory Visit: Payer: Self-pay | Admitting: Family Medicine

## 2019-06-21 DIAGNOSIS — K219 Gastro-esophageal reflux disease without esophagitis: Secondary | ICD-10-CM

## 2019-06-21 DIAGNOSIS — F418 Other specified anxiety disorders: Secondary | ICD-10-CM

## 2019-06-21 MED ORDER — CLONAZEPAM 1 MG PO TABS: 1.0000 mg | ORAL_TABLET | Freq: Every evening | ORAL | 0 refills | Status: DC | PRN

## 2019-06-21 NOTE — Telephone Encounter (Signed)
Requesting: Clonazepam 1mg  UDS: 07/25/2017 Last Visit: 04/05/2019 Next Visit: 10/11/2018 Last Refill: 05/08/2019, #30, 0 RF  Please Advise

## 2019-07-25 ENCOUNTER — Other Ambulatory Visit: Payer: Self-pay | Admitting: Family Medicine

## 2019-07-25 DIAGNOSIS — F418 Other specified anxiety disorders: Secondary | ICD-10-CM

## 2019-07-25 MED ORDER — CLONAZEPAM 1 MG PO TABS: 1.0000 mg | ORAL_TABLET | Freq: Every evening | ORAL | 0 refills | Status: DC | PRN

## 2019-07-25 NOTE — Telephone Encounter (Signed)
Requesting:Clonazepam  Contract:07/25/17 UDS:07/25/17 Last OV:04/05/19 Next OV:10/11/19 Last Refill:06/21/19 #30 0rf Database:   Please advise

## 2019-08-02 ENCOUNTER — Telehealth: Payer: Self-pay | Admitting: Family Medicine

## 2019-08-02 NOTE — Telephone Encounter (Signed)
Pt was disconnected upon transfer from [facility]. I called and LM for pt to call back for appt regarding cracking skin on sacrum.

## 2019-08-03 ENCOUNTER — Encounter: Payer: Self-pay | Admitting: Family Medicine

## 2019-08-03 ENCOUNTER — Other Ambulatory Visit: Payer: Self-pay

## 2019-08-03 ENCOUNTER — Ambulatory Visit (INDEPENDENT_AMBULATORY_CARE_PROVIDER_SITE_OTHER): Payer: 59 | Admitting: Family Medicine

## 2019-08-03 DIAGNOSIS — L03317 Cellulitis of buttock: Secondary | ICD-10-CM

## 2019-08-03 DIAGNOSIS — L0231 Cutaneous abscess of buttock: Secondary | ICD-10-CM | POA: Diagnosis not present

## 2019-08-03 MED ORDER — MUPIROCIN CALCIUM 2 % EX CREA: 1.0000 "application " | TOPICAL_CREAM | Freq: Two times a day (BID) | CUTANEOUS | 0 refills | Status: AC

## 2019-08-03 MED ORDER — DOXYCYCLINE HYCLATE 100 MG PO TABS: 100.0000 mg | ORAL_TABLET | Freq: Two times a day (BID) | ORAL | 0 refills | Status: DC

## 2019-08-03 NOTE — Progress Notes (Signed)
Virtual Visit via Video Note  I connected with Grace Vaughn on 08/03/19 at  9:00 AM EST by a video enabled telemedicine application and verified that I am speaking with the correct person using two identifiers.  Location: Patient: home alone Provider: home    I discussed the limitations of evaluation and management by telemedicine and the availability of in person appointments. The patient expressed understanding and agreed to proceed.  History of Present Illness: Pt is home c/o raw area around rectum that is    Observations/Objective: .no vitals obtained  Pt in NAD   Assessment and Plan: 1. Cellulitis and abscess of buttock abx per orders F/u 1-2 weeks or sooner prn In office  - doxycycline (VIBRA-TABS) 100 MG tablet; Take 1 tablet (100 mg total) by mouth 2 (two) times daily.  Dispense: 20 tablet; Refill: 0 - mupirocin cream (BACTROBAN) 2 %; Apply 1 application topically 2 (two) times daily.  Dispense: 15 g; Refill: 0  Follow Up Instructions:    I discussed the assessment and treatment plan with the patient. The patient was provided an opportunity to ask questions and all were answered. The patient agreed with the plan and demonstrated an understanding of the instructions.   The patient was advised to call back or seek an in-person evaluation if the symptoms worsen or if the condition fails to improve as anticipated.    Ann Held, DO

## 2019-08-06 NOTE — Telephone Encounter (Signed)
No advise-- I saw it during our visit---is there a way to put it in her chart?

## 2019-09-23 ENCOUNTER — Other Ambulatory Visit: Payer: Self-pay | Admitting: Family Medicine

## 2019-09-23 DIAGNOSIS — F418 Other specified anxiety disorders: Secondary | ICD-10-CM

## 2019-09-24 ENCOUNTER — Other Ambulatory Visit: Payer: Self-pay | Admitting: *Deleted

## 2019-09-24 ENCOUNTER — Other Ambulatory Visit: Payer: Self-pay | Admitting: Family Medicine

## 2019-09-24 DIAGNOSIS — F418 Other specified anxiety disorders: Secondary | ICD-10-CM

## 2019-09-24 DIAGNOSIS — K219 Gastro-esophageal reflux disease without esophagitis: Secondary | ICD-10-CM

## 2019-09-24 MED ORDER — OMEPRAZOLE 40 MG PO CPDR: DELAYED_RELEASE_CAPSULE | ORAL | 0 refills | Status: DC

## 2019-09-24 MED ORDER — CLONAZEPAM 1 MG PO TABS: 1.0000 mg | ORAL_TABLET | Freq: Every evening | ORAL | 0 refills | Status: DC | PRN

## 2019-09-24 NOTE — Telephone Encounter (Signed)
Requesting: klonopin  Contract: none UDS:07/25/17 Last Visit:08/03/19 Next Visit:10/11/19 Last Refill:07/25/19  Please Advise

## 2019-10-10 ENCOUNTER — Other Ambulatory Visit: Payer: Self-pay

## 2019-10-11 ENCOUNTER — Encounter: Payer: Self-pay | Admitting: Family Medicine

## 2019-10-11 ENCOUNTER — Other Ambulatory Visit: Payer: Self-pay

## 2019-10-11 ENCOUNTER — Ambulatory Visit (INDEPENDENT_AMBULATORY_CARE_PROVIDER_SITE_OTHER): Payer: BC Managed Care – PPO | Admitting: Family Medicine

## 2019-10-11 VITALS — BP 120/92 | HR 77 | Temp 97.8°F | Resp 18 | Ht 63.5 in | Wt 201.4 lb

## 2019-10-11 DIAGNOSIS — F418 Other specified anxiety disorders: Secondary | ICD-10-CM

## 2019-10-11 DIAGNOSIS — R232 Flushing: Secondary | ICD-10-CM | POA: Diagnosis not present

## 2019-10-11 DIAGNOSIS — K219 Gastro-esophageal reflux disease without esophagitis: Secondary | ICD-10-CM | POA: Diagnosis not present

## 2019-10-11 DIAGNOSIS — E876 Hypokalemia: Secondary | ICD-10-CM

## 2019-10-11 DIAGNOSIS — Z0000 Encounter for general adult medical examination without abnormal findings: Secondary | ICD-10-CM

## 2019-10-11 DIAGNOSIS — I10 Essential (primary) hypertension: Secondary | ICD-10-CM | POA: Diagnosis not present

## 2019-10-11 DIAGNOSIS — G4733 Obstructive sleep apnea (adult) (pediatric): Secondary | ICD-10-CM

## 2019-10-11 DIAGNOSIS — Z780 Asymptomatic menopausal state: Secondary | ICD-10-CM

## 2019-10-11 LAB — COMPREHENSIVE METABOLIC PANEL
ALT: 15 U/L (ref 0–35)
AST: 19 U/L (ref 0–37)
Albumin: 4.1 g/dL (ref 3.5–5.2)
Alkaline Phosphatase: 71 U/L (ref 39–117)
BUN: 24 mg/dL — ABNORMAL HIGH (ref 6–23)
CO2: 27 mEq/L (ref 19–32)
Calcium: 9.7 mg/dL (ref 8.4–10.5)
Chloride: 104 mEq/L (ref 96–112)
Creatinine, Ser: 0.73 mg/dL (ref 0.40–1.20)
GFR: 80.51 mL/min (ref >60.00)
Glucose, Bld: 96 mg/dL (ref 70–99)
Potassium: 3.9 mEq/L (ref 3.5–5.1)
Sodium: 137 mEq/L (ref 135–145)
Total Bilirubin: 0.4 mg/dL (ref 0.2–1.2)
Total Protein: 6.9 g/dL (ref 6.0–8.3)

## 2019-10-11 LAB — CBC WITH DIFFERENTIAL/PLATELET
Basophils Absolute: 0.1 10*3/uL (ref 0.0–0.1)
Basophils Relative: 1.2 % (ref 0.0–3.0)
Eosinophils Absolute: 0 10*3/uL (ref 0.0–0.7)
Eosinophils Relative: 0.6 % (ref 0.0–5.0)
HCT: 37.6 % (ref 36.0–46.0)
Hemoglobin: 12.6 g/dL (ref 12.0–15.0)
Lymphocytes Relative: 39.9 % (ref 12.0–46.0)
Lymphs Abs: 3 10*3/uL (ref 0.7–4.0)
MCHC: 33.5 g/dL (ref 30.0–36.0)
MCV: 90.7 fl (ref 78.0–100.0)
Monocytes Absolute: 0.7 10*3/uL (ref 0.1–1.0)
Monocytes Relative: 9.5 % (ref 3.0–12.0)
Neutro Abs: 3.6 10*3/uL (ref 1.4–7.7)
Neutrophils Relative %: 48.8 % (ref 43.0–77.0)
Platelets: 301 10*3/uL (ref 150.0–400.0)
RBC: 4.14 Mil/uL (ref 3.87–5.11)
RDW: 13.7 % (ref 11.5–15.5)
WBC: 7.5 10*3/uL (ref 4.0–10.5)

## 2019-10-11 LAB — LIPID PANEL
Cholesterol: 203 mg/dL — ABNORMAL HIGH (ref 0–200)
HDL: 69.2 mg/dL (ref >39.00)
LDL Cholesterol: 116 mg/dL — ABNORMAL HIGH (ref 0–99)
NonHDL: 133.51
Total CHOL/HDL Ratio: 3
Triglycerides: 87 mg/dL (ref 0.0–149.0)
VLDL: 17.4 mg/dL (ref 0.0–40.0)

## 2019-10-11 LAB — TSH: TSH: 7.44 u[IU]/mL — ABNORMAL HIGH (ref 0.35–4.50)

## 2019-10-11 MED ORDER — OMEPRAZOLE 40 MG PO CPDR: DELAYED_RELEASE_CAPSULE | ORAL | 3 refills | Status: AC

## 2019-10-11 MED ORDER — HYDROCHLOROTHIAZIDE 25 MG PO TABS: ORAL_TABLET | ORAL | 1 refills | Status: DC

## 2019-10-11 MED ORDER — POTASSIUM CHLORIDE CRYS ER 20 MEQ PO TBCR: 40.0000 meq | EXTENDED_RELEASE_TABLET | Freq: Every day | ORAL | 3 refills | Status: AC

## 2019-10-11 MED ORDER — METOPROLOL SUCCINATE ER 50 MG PO TB24: ORAL_TABLET | ORAL | 1 refills | Status: DC

## 2019-10-11 MED ORDER — ESTRADIOL 0.5 MG PO TABS: ORAL_TABLET | ORAL | 1 refills | Status: DC

## 2019-10-11 MED ORDER — BUPROPION HCL ER (SR) 150 MG PO TB12: 150.0000 mg | ORAL_TABLET | Freq: Every day | ORAL | 3 refills | Status: DC

## 2019-10-11 NOTE — Progress Notes (Signed)
Subjective:     Grace Vaughn is a 64 y.o. female and is here for a comprehensive physical exam. The patient reports no problems. Pt needs neuro f/u for cpap and med refills  Social History   Socioeconomic History  . Marital status: Married    Spouse name: Not on file  . Number of children: 2  . Years of education: BA  . Highest education level: Not on file  Occupational History    Comment: [employer]  Tobacco Use  . Smoking status: Never Smoker  . Smokeless tobacco: Never Used  Substance and Sexual Activity  . Alcohol use: Yes    Comment: rare  . Drug use: No  . Sexual activity: Yes  Other Topics Concern  . Not on file  Social History Narrative   Exercise--- walking 10,000 steps daily   Drinks 1-2 caffeine drinks a day    Social Determinants of Health   Financial Resource Strain:   . Difficulty of Paying Living Expenses:   Food Insecurity:   . Worried About Charity fundraiser in the Last Year:   . Arboriculturist in the Last Year:   Transportation Needs:   . Film/video editor (Medical):   Marland Kitchen Lack of Transportation (Non-Medical):   Physical Activity:   . Days of Exercise per Week:   . Minutes of Exercise per Session:   Stress:   . Feeling of Stress :   Social Connections:   . Frequency of Communication with Friends and Family:   . Frequency of Social Gatherings with Friends and Family:   . Attends Religious Services:   . Active Member of Clubs or Organizations:   . Attends Archivist Meetings:   Marland Kitchen Marital Status:   Intimate Partner Violence:   . Fear of Current or Ex-Partner:   . Emotionally Abused:   Marland Kitchen Physically Abused:   . Sexually Abused:    Health Maintenance  Topic Date Due  . MAMMOGRAM  10/03/2019  . COLONOSCOPY  07/18/2020  . TETANUS/TDAP  05/05/2025  . INFLUENZA VACCINE  Completed  . Hepatitis C Screening  Completed  . HIV Screening  Completed  . PAP SMEAR-Modifier  Discontinued    The following portions of  the patient's history were reviewed and updated as appropriate:  She  has a past medical history of Anxiety, Colon polyps, GERD (gastroesophageal reflux disease), Hypertension, Insomnia, and Vertigo. She does not have any pertinent problems on file. She  has a past surgical history that includes Cesarean section (1986, 1989); Abdominal hysterectomy (01/1999); Cholecystectomy; and Rotator cuff repair (Right). Her family history includes Breast cancer in her mother, sister, and sister; Cataracts in her father and mother; Heart disease in her father and mother. She  reports that she has never smoked. She has never used smokeless tobacco. She reports current alcohol use. She reports that she does not use drugs. She has a current medication list which includes the following prescription(s): aspirin, bupropion, cholecalciferol, clonazepam, estradiol, hydrochlorothiazide, krill oil, metoprolol succinate, mupirocin cream, omeprazole, OVER THE COUNTER MEDICATION, potassium chloride sa, probiotic product, and valacyclovir. Current Outpatient Medications on File Prior to Visit  Medication Sig Dispense Refill  . aspirin 81 MG tablet Take 81 mg by mouth daily.    . cholecalciferol (VITAMIN D) 1000 UNITS tablet Take 1,000 Units by mouth daily.    . clonazePAM (KLONOPIN) 1 MG tablet Take 1 tablet (1 mg total) by mouth at bedtime as needed for anxiety. For sleep  due to restless leg syndrome 30 tablet 0  . KRILL OIL PO Take 1 capsule by mouth daily.    . mupirocin cream (BACTROBAN) 2 % Apply 1 application topically 2 (two) times daily. 15 g 0  . OVER THE COUNTER MEDICATION Take 1 tablet by mouth daily. Super k    . Probiotic Product (PROBIOTIC DAILY PO) Take 1 capsule by mouth daily.    . valACYclovir (VALTREX) 1000 MG tablet Take 1 tablet (1,000 mg total) by mouth 2 (two) times daily. 20 tablet 5   No current facility-administered medications on file prior to visit.   She is allergic to augmentin [amoxicillin-pot  clavulanate]; cephalexin; diovan [valsartan]; erythromycin; lisinopril; and losartan..  Review of Systems Review of Systems  Constitutional: Negative for activity change, appetite change and fatigue.  HENT: Negative for hearing loss, congestion, tinnitus and ear discharge.  dentist q20m Eyes: Negative for visual disturbance (see optho q1y -- vision corrected to 20/20 with glasses).  Respiratory: Negative for cough, chest tightness and shortness of breath.   Cardiovascular: Negative for chest pain, palpitations and leg swelling.  Gastrointestinal: Negative for abdominal pain, diarrhea, constipation and abdominal distention.  Genitourinary: Negative for urgency, frequency, decreased urine volume and difficulty urinating.  Musculoskeletal: Negative for back pain, arthralgias and gait problem.  Skin: Negative for color change, pallor and rash.  Neurological: Negative for dizziness, light-headedness, numbness and headaches.  Hematological: Negative for adenopathy. Does not bruise/bleed easily.  Psychiatric/Behavioral: Negative for suicidal ideas, confusion, sleep disturbance, self-injury, dysphoric mood, decreased concentration and agitation.       Objective:    BP (!) 120/92 (BP Location: Right Arm, Patient Position: Sitting, Cuff Size: Large)   Pulse 77   Temp 97.8 F (36.6 C) (Temporal)   Resp 18   Ht 5' 3.5" (1.613 m)   Wt 201 lb 6.4 oz (91.4 kg)   SpO2 98%   BMI 35.12 kg/m  General appearance: alert, cooperative, appears stated age and no distress Head: Normocephalic, without obvious abnormality, atraumatic Eyes: conjunctivae/corneas clear. PERRL, EOM's intact. Fundi benign. Ears: normal TM's and external ear canals both ears Neck: no adenopathy, no carotid bruit, no JVD, supple, symmetrical, trachea midline and thyroid not enlarged, symmetric, no tenderness/mass/nodules Back: symmetric, no curvature. ROM normal. No CVA tenderness. Lungs: clear to auscultation  bilaterally Breasts: normal appearance, no masses or tenderness Heart: regular rate and rhythm, S1, S2 normal, no murmur, click, rub or gallop Abdomen: soft, non-tender; bowel sounds normal; no masses,  no organomegaly Pelvic: not indicated; status post hysterectomy, negative ROS Extremities: extremities normal, atraumatic, no cyanosis or edema Pulses: 2+ and symmetric Skin: Skin color, texture, turgor normal. No rashes or lesions Lymph nodes: Cervical, supraclavicular, and axillary nodes normal. Neurologic: Alert and oriented X 3, normal strength and tone. Normal symmetric reflexes. Normal coordination and gait    Assessment:    Healthy female exam.      Plan:    ghm utd Check labs  See After Visit Summary for Counseling Recommendations    1. Essential hypertension Well controlled, no changes to meds. Encouraged heart healthy diet such as the DASH diet and exercise as tolerated.  - metoprolol succinate (TOPROL-XL) 50 MG 24 hr tablet; TAKE 1 TABLET BY MOUTH EVERY DAY WITH A MEAL OR IMMEDIATELY FOLLOWING A MEAL  Dispense: 90 tablet; Refill: 1 - hydrochlorothiazide (HYDRODIURIL) 25 MG tablet; TAKE 1 TABLET(25 MG) BY MOUTH DAILY  Dispense: 90 tablet; Refill: 1 - Lipid panel - CBC with Differential/Platelet - TSH - Comprehensive  metabolic panel  2. Hot flashes Stable Refill meds  - estradiol (ESTRACE) 0.5 MG tablet; TAKE 1 TABLET(0.5 MG) BY MOUTH DAILY  Dispense: 90 tablet; Refill: 1  3. Gastroesophageal reflux disease Stable---  Refill prilosec - omeprazole (PRILOSEC) 40 MG capsule; TAKE 1 CAPSULE(40 MG) BY MOUTH DAILY  Dispense: 90 capsule; Refill: 3  4. Hypokalemia Check labs  - potassium chloride SA (KLOR-CON) 20 MEQ tablet; Take 2 tablets (40 mEq total) by mouth daily.  Dispense: 180 tablet; Refill: 3 - Comprehensive metabolic panel  5. Depression with anxiety Stable  Refill meds  - buPROPion (WELLBUTRIN SR) 150 MG 12 hr tablet; Take 1 tablet (150 mg total) by mouth  daily.  Dispense: 90 tablet; Refill: 3  6. Menopause   - estradiol (ESTRACE) 0.5 MG tablet; TAKE 1 TABLET(0.5 MG) BY MOUTH DAILY  Dispense: 90 tablet; Refill: 1  7. Preventative health care See above  - Lipid panel - CBC with Differential/Platelet - TSH - Comprehensive metabolic panel  8. Obstructive sleep apnea syndrome F/u neuro -- pt on cpap - Ambulatory referral to Neurology

## 2019-10-11 NOTE — Patient Instructions (Signed)

## 2019-10-13 ENCOUNTER — Other Ambulatory Visit: Payer: Self-pay | Admitting: Family Medicine

## 2019-10-13 DIAGNOSIS — E039 Hypothyroidism, unspecified: Secondary | ICD-10-CM

## 2019-10-30 ENCOUNTER — Other Ambulatory Visit: Payer: Self-pay | Admitting: Emergency Medicine

## 2019-10-30 ENCOUNTER — Other Ambulatory Visit (HOSPITAL_BASED_OUTPATIENT_CLINIC_OR_DEPARTMENT_OTHER): Payer: Self-pay | Admitting: Family Medicine

## 2019-10-30 DIAGNOSIS — Z1231 Encounter for screening mammogram for malignant neoplasm of breast: Secondary | ICD-10-CM

## 2019-10-30 DIAGNOSIS — E039 Hypothyroidism, unspecified: Secondary | ICD-10-CM

## 2019-10-31 ENCOUNTER — Other Ambulatory Visit: Payer: Self-pay

## 2019-10-31 ENCOUNTER — Other Ambulatory Visit: Payer: BC Managed Care – PPO

## 2019-10-31 ENCOUNTER — Ambulatory Visit (HOSPITAL_BASED_OUTPATIENT_CLINIC_OR_DEPARTMENT_OTHER)
Admission: RE | Admit: 2019-10-31 | Discharge: 2019-10-31 | Disposition: A | Discharge status: Home / Self Care | Payer: BC Managed Care – PPO | Source: Ambulatory Visit | Attending: Family Medicine | Admitting: Family Medicine

## 2019-10-31 DIAGNOSIS — Z1231 Encounter for screening mammogram for malignant neoplasm of breast: Secondary | ICD-10-CM | POA: Diagnosis not present

## 2019-10-31 DIAGNOSIS — E039 Hypothyroidism, unspecified: Secondary | ICD-10-CM

## 2019-11-01 ENCOUNTER — Other Ambulatory Visit: Payer: Self-pay | Admitting: Family Medicine

## 2019-11-01 DIAGNOSIS — E039 Hypothyroidism, unspecified: Secondary | ICD-10-CM

## 2019-11-01 LAB — THYROID PANEL WITH TSH
Free Thyroxine Index: 2.4 (ref 1.4–3.8)
T3 Uptake: 29 % (ref 22–35)
T4, Total: 8.3 ug/dL (ref 5.1–11.9)
TSH: 6.16 mIU/L — ABNORMAL HIGH (ref 0.40–4.50)

## 2019-11-02 ENCOUNTER — Other Ambulatory Visit: Payer: Self-pay | Admitting: Family Medicine

## 2019-11-02 DIAGNOSIS — R7989 Other specified abnormal findings of blood chemistry: Secondary | ICD-10-CM

## 2019-11-02 MED ORDER — LEVOTHYROXINE SODIUM 25 MCG PO TABS: 25.0000 ug | ORAL_TABLET | Freq: Every day | ORAL | 2 refills | Status: DC

## 2019-11-03 ENCOUNTER — Other Ambulatory Visit: Payer: Self-pay | Admitting: Family Medicine

## 2019-11-03 DIAGNOSIS — F418 Other specified anxiety disorders: Secondary | ICD-10-CM

## 2019-11-05 MED ORDER — CLONAZEPAM 1 MG PO TABS: 1.0000 mg | ORAL_TABLET | Freq: Every evening | ORAL | 0 refills | Status: DC | PRN

## 2019-11-05 NOTE — Telephone Encounter (Signed)
Requesting: klonopin Contract:yes UDS:n/a Last OV:10/11/19 Next OV:n/a Last Refill:09/24/19  #30-0rf Database:   Please advise

## 2019-12-07 ENCOUNTER — Other Ambulatory Visit: Payer: Self-pay | Admitting: Family Medicine

## 2019-12-07 DIAGNOSIS — F418 Other specified anxiety disorders: Secondary | ICD-10-CM

## 2019-12-07 MED ORDER — CLONAZEPAM 1 MG PO TABS: 1.0000 mg | ORAL_TABLET | Freq: Every evening | ORAL | 0 refills | Status: DC | PRN

## 2019-12-07 NOTE — Telephone Encounter (Signed)
Clonazepam refill.   Last OV: 10/11/19 Last Fill: 11/05/19 #30 and 0RF UDS: 07/25/2017 Low risk

## 2020-01-02 ENCOUNTER — Other Ambulatory Visit (INDEPENDENT_AMBULATORY_CARE_PROVIDER_SITE_OTHER): Payer: BC Managed Care – PPO

## 2020-01-02 ENCOUNTER — Other Ambulatory Visit: Payer: Self-pay

## 2020-01-02 DIAGNOSIS — R7989 Other specified abnormal findings of blood chemistry: Secondary | ICD-10-CM

## 2020-01-02 LAB — TSH: TSH: 3.72 u[IU]/mL (ref 0.35–4.50)

## 2020-01-07 ENCOUNTER — Other Ambulatory Visit: Payer: Self-pay | Admitting: Family Medicine

## 2020-01-07 DIAGNOSIS — F418 Other specified anxiety disorders: Secondary | ICD-10-CM

## 2020-01-08 MED ORDER — CLONAZEPAM 1 MG PO TABS: 1.0000 mg | ORAL_TABLET | Freq: Every evening | ORAL | 0 refills | Status: DC | PRN

## 2020-01-08 NOTE — Telephone Encounter (Signed)
Clonazepam refill.   Last OV: 08/03/2019  Last Fill: 12/07/2019 #30 and 0RF Pt sig: 1 tab qhs prn UDS: 07/25/2017 Low risk

## 2020-02-21 ENCOUNTER — Other Ambulatory Visit: Payer: Self-pay | Admitting: Family Medicine

## 2020-02-21 DIAGNOSIS — F418 Other specified anxiety disorders: Secondary | ICD-10-CM

## 2020-02-21 MED ORDER — CLONAZEPAM 1 MG PO TABS: 1.0000 mg | ORAL_TABLET | Freq: Every evening | ORAL | 0 refills | Status: DC | PRN

## 2020-02-21 NOTE — Telephone Encounter (Signed)
Last seen 10-11-2019 Last refill- 01-08-2020 Next office visit--not scheduled

## 2020-02-22 NOTE — Telephone Encounter (Signed)
Patient requesting 90Day supply

## 2020-02-22 NOTE — Telephone Encounter (Signed)
Medication: levothyroxine (SYNTHROID) 25 MCG tablet [[id number]]   Has the patient contacted their pharmacy? No. (If no, request that the patient contact the pharmacy for the refill.) (If yes, when and what did the pharmacy advise?)  Preferred Pharmacy (with phone number or street name): [id number] - [address], [address]  [place], [place] [address]  Phone:  [phone] Fax:  [phone]  DEA #:  [id number]  Agent: Please be advised that RX refills may take up to 3 business days. We ask that you follow-up with your pharmacy.

## 2020-03-19 IMAGING — MG DIGITAL SCREENING BILATERAL MAMMOGRAM WITH TOMO AND CAD
8 series · 8 of 24 positions shown · non-contrast
Comparison: Previous exam(s).

CLINICAL DATA: Screening. Strong family history of breast cancer,
including in patient's mother as well as 2 sisters.

EXAM:
DIGITAL SCREENING BILATERAL MAMMOGRAM WITH TOMO AND CAD

[L CC synth-2D]
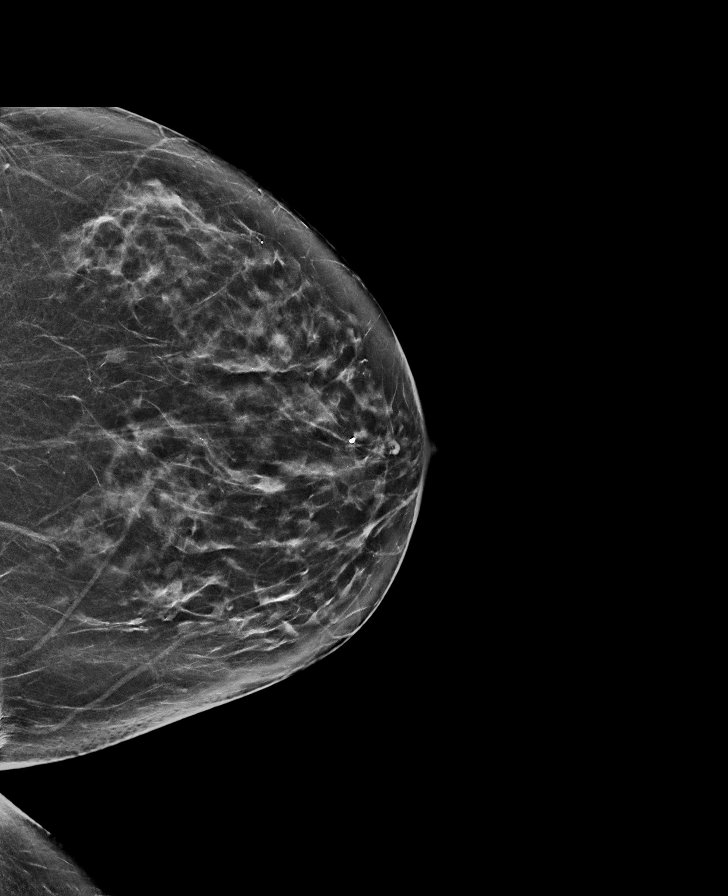

[R CC synth-2D]
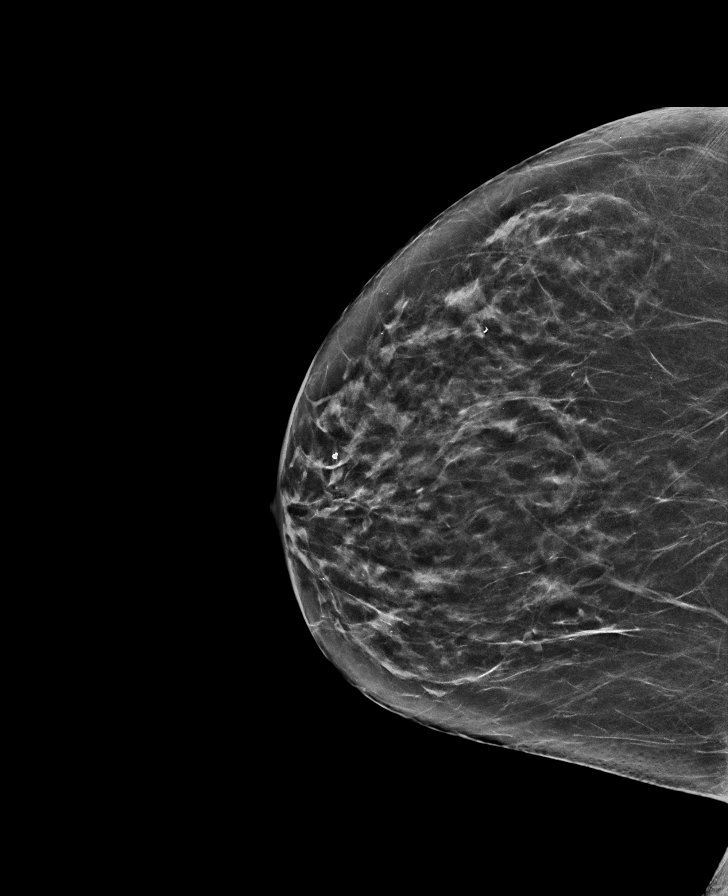

[L MLO synth-2D]
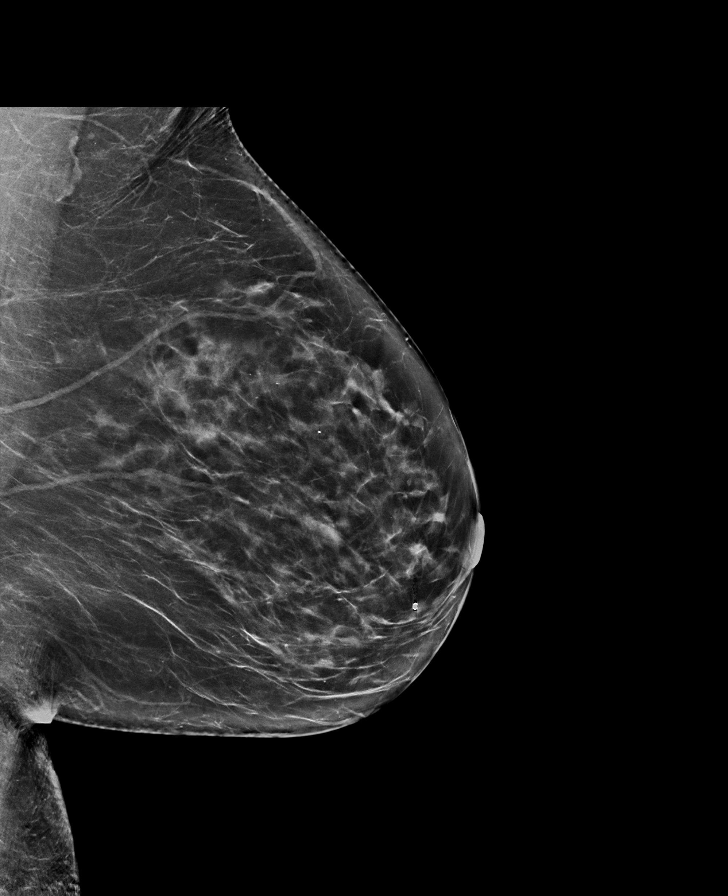

[R MLO synth-2D]
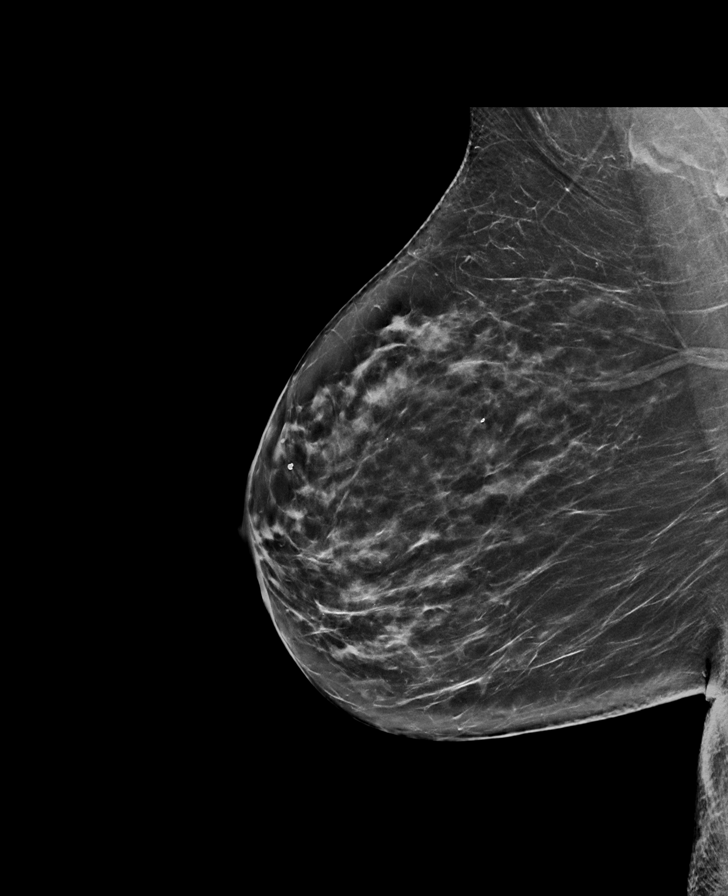

[L MLO tomo · tomo slice 39/76.0]
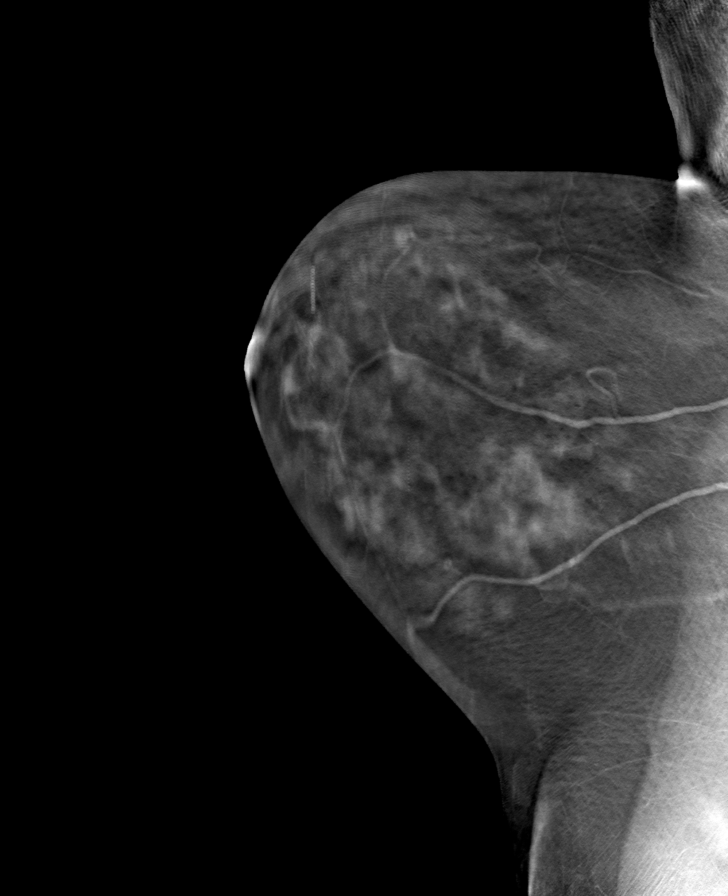

[R CC tomo · tomo slice 31/62.0]
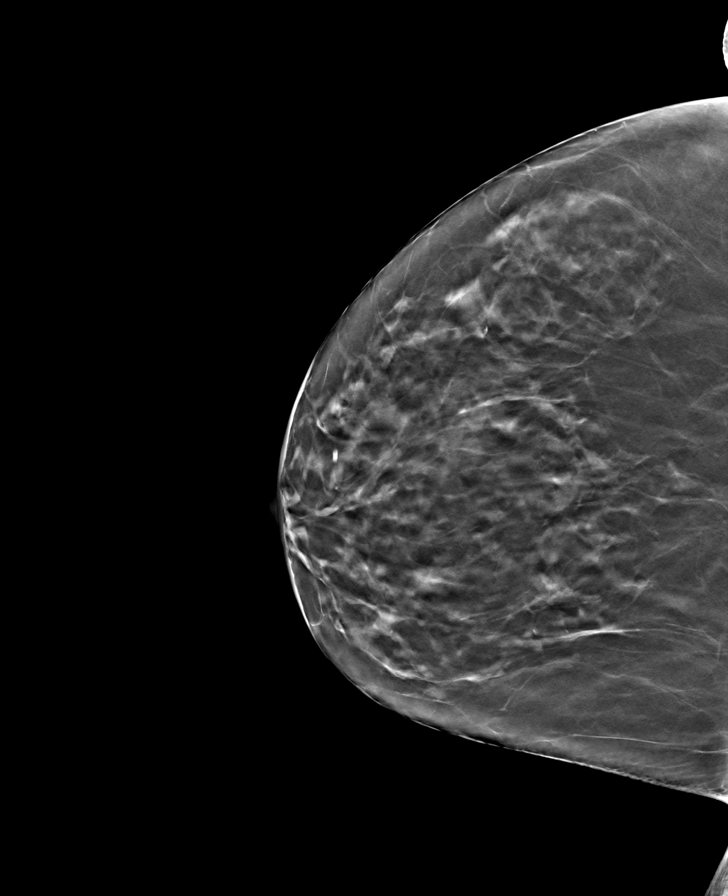

[L CC tomo · tomo slice 33/65.0]
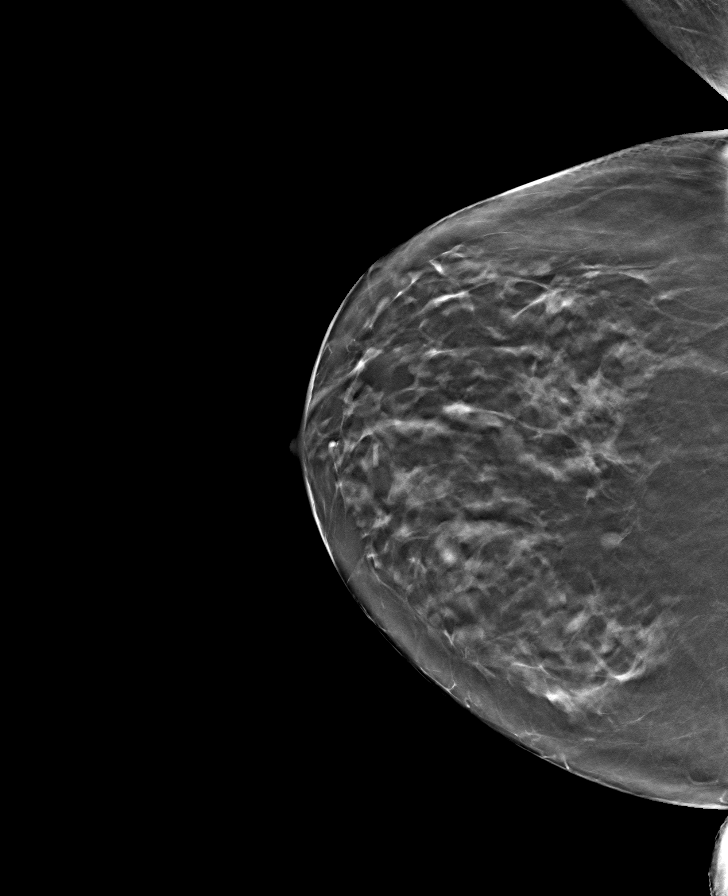

[R MLO tomo · tomo slice 36/71.0]
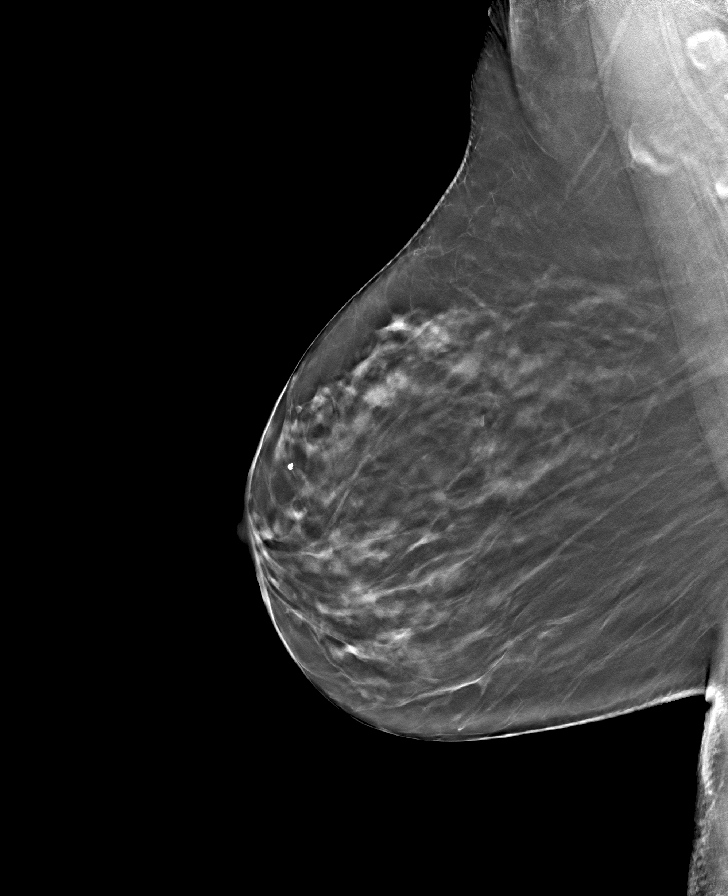

[8 of 24 positions shown; findings below may reference images not displayed]

ACR Breast Density Category c: The breast tissue is heterogeneously
dense, which may obscure small masses.
FINDINGS: There are no findings suspicious for malignancy. Images were
processed with CAD.
IMPRESSION: No mammographic evidence of malignancy. A result letter of this
screening mammogram will be mailed directly to the patient.

RECOMMENDATION:
1.  Screening mammogram in one year.(Code:[id number])

2. Annual screening mammography as well as breast MRI is recommended
given patient's strong family history of breast cancer and greater
than 20% lifetime risk of developing breast cancer.

BI-RADS CATEGORY  1: Negative.

## 2020-03-20 ENCOUNTER — Other Ambulatory Visit: Payer: Self-pay | Admitting: Family Medicine

## 2020-03-20 DIAGNOSIS — I10 Essential (primary) hypertension: Secondary | ICD-10-CM

## 2020-04-07 ENCOUNTER — Other Ambulatory Visit: Payer: Self-pay | Admitting: Family Medicine

## 2020-04-07 DIAGNOSIS — F418 Other specified anxiety disorders: Secondary | ICD-10-CM

## 2020-04-07 MED ORDER — CLONAZEPAM 1 MG PO TABS: 1.0000 mg | ORAL_TABLET | Freq: Every evening | ORAL | 0 refills | Status: DC | PRN

## 2020-04-07 NOTE — Telephone Encounter (Signed)
Last written: 02/21/20 Last ov: 10/11/19 Next ov: none Contract: 07/25/17 UDS: 07/25/17

## 2020-04-09 ENCOUNTER — Other Ambulatory Visit: Payer: Self-pay | Admitting: Family Medicine

## 2020-04-09 ENCOUNTER — Telehealth: Payer: Self-pay | Admitting: Family Medicine

## 2020-04-09 DIAGNOSIS — F418 Other specified anxiety disorders: Secondary | ICD-10-CM

## 2020-04-09 MED ORDER — CLONAZEPAM 1 MG PO TABS: 1.0000 mg | ORAL_TABLET | Freq: Every evening | ORAL | 0 refills | Status: DC | PRN

## 2020-04-09 NOTE — Telephone Encounter (Signed)
Prescription for clonazePAM (KLONOPIN) 1 MG tablet [[id number]]. Sent to wrong pharmacy , patient has recently moved [place], patient would like medication sent to [facility] @ [address], [place], [place] [address].  Patient is also asking if Dr Etter Sjogren knows anyone in [place] that she can establish care with , any recommendations would be nice and if so please message patient via my chart.

## 2020-04-09 NOTE — Telephone Encounter (Signed)
Pt states she recently moved and would like a new Rx sent to a new pharmacy. Pt also wanted to know of any recommendations for a new pcp in [place].   Requesting: Klonopin Contract: 07/25/2017 UDS: 07/25/2017 Last OV: 10/11/19 Next OV:  Last Refill: 04/07/20, #30--0 RF Database:   Please advise

## 2020-04-09 NOTE — Telephone Encounter (Signed)
Med sent   Dr Fonnie Birkenhead is in [place]---- he worked with a friend of mine and she hightly recommended him---if that is not close enough she said she would ask him for someone closer.

## 2020-04-10 ENCOUNTER — Other Ambulatory Visit: Payer: Self-pay | Admitting: Family Medicine

## 2020-04-10 NOTE — Telephone Encounter (Signed)
Dr. Fonnie Birkenhead is in [place], [place]. Do you know of anyone in or around [place], [place]?

## 2020-05-28 ENCOUNTER — Encounter: Payer: Self-pay | Admitting: Family Medicine

## 2020-05-28 ENCOUNTER — Other Ambulatory Visit: Payer: Self-pay | Admitting: Family Medicine

## 2020-05-28 DIAGNOSIS — R232 Flushing: Secondary | ICD-10-CM

## 2020-05-28 DIAGNOSIS — I10 Essential (primary) hypertension: Secondary | ICD-10-CM

## 2020-05-28 DIAGNOSIS — F418 Other specified anxiety disorders: Secondary | ICD-10-CM

## 2020-05-28 DIAGNOSIS — Z780 Asymptomatic menopausal state: Secondary | ICD-10-CM

## 2020-05-28 NOTE — Telephone Encounter (Signed)
Requesting: Klonopin Contract: 07/25/2017  UDS: 07/25/2017 Last OV: 10/11/19 Next OV: N/A Last Refill: 04/09/20, #30--0 RF Database:   Please advise

## 2020-05-29 MED ORDER — CLONAZEPAM 1 MG PO TABS: 1.0000 mg | ORAL_TABLET | Freq: Every evening | ORAL | 0 refills | Status: DC | PRN

## 2020-05-30 MED ORDER — METOPROLOL SUCCINATE ER 50 MG PO TB24: ORAL_TABLET | ORAL | 1 refills | Status: AC

## 2020-05-30 MED ORDER — ESTRADIOL 0.5 MG PO TABS: ORAL_TABLET | ORAL | 1 refills | Status: AC

## 2020-06-02 NOTE — Telephone Encounter (Signed)
Noted  

## 2020-07-07 ENCOUNTER — Other Ambulatory Visit: Payer: Self-pay | Admitting: Family Medicine

## 2020-07-07 DIAGNOSIS — F418 Other specified anxiety disorders: Secondary | ICD-10-CM

## 2020-07-07 MED ORDER — CLONAZEPAM 1 MG PO TABS: 1.0000 mg | ORAL_TABLET | Freq: Every evening | ORAL | 0 refills | Status: DC | PRN

## 2020-07-07 NOTE — Telephone Encounter (Signed)
Requesting: clonazepam 1mg  Contract: 07/25/2017 UDS: 07/25/2017 Last Visit: 10/11/2019 Next Visit: None Last Refill: 05/29/2020 #30 and 0RF  Please Advise

## 2020-08-10 ENCOUNTER — Other Ambulatory Visit: Payer: Self-pay | Admitting: Family Medicine

## 2020-08-10 DIAGNOSIS — F418 Other specified anxiety disorders: Secondary | ICD-10-CM

## 2020-08-11 MED ORDER — CLONAZEPAM 1 MG PO TABS: 1.0000 mg | ORAL_TABLET | Freq: Every evening | ORAL | 0 refills | Status: DC | PRN

## 2020-08-11 NOTE — Telephone Encounter (Signed)
Requesting: clonazepam Contract: 07/25/17 UDS: 07/25/17 Last Visit: 10/11/19 Next Visit: none Last Refill: 07/07/20  Please Advise

## 2020-09-05 ENCOUNTER — Other Ambulatory Visit: Payer: Self-pay | Admitting: Family Medicine

## 2020-09-05 DIAGNOSIS — F418 Other specified anxiety disorders: Secondary | ICD-10-CM

## 2020-09-18 ENCOUNTER — Other Ambulatory Visit: Payer: Self-pay | Admitting: Family Medicine

## 2020-09-18 DIAGNOSIS — F418 Other specified anxiety disorders: Secondary | ICD-10-CM

## 2020-09-29 ENCOUNTER — Encounter: Payer: Self-pay | Admitting: Family Medicine

## 2020-09-29 DIAGNOSIS — F418 Other specified anxiety disorders: Secondary | ICD-10-CM

## 2020-09-30 ENCOUNTER — Other Ambulatory Visit: Payer: Self-pay | Admitting: Family Medicine

## 2020-09-30 DIAGNOSIS — F418 Other specified anxiety disorders: Secondary | ICD-10-CM

## 2020-09-30 MED ORDER — CLONAZEPAM 1 MG PO TABS: 1.0000 mg | ORAL_TABLET | Freq: Every evening | ORAL | 0 refills | Status: AC | PRN

## 2020-09-30 NOTE — Telephone Encounter (Signed)
Refilled

## 2021-03-08 ENCOUNTER — Other Ambulatory Visit: Payer: Self-pay | Admitting: Family Medicine

## 2021-09-14 ENCOUNTER — Other Ambulatory Visit: Payer: Self-pay | Admitting: Family Medicine

## 2021-09-14 DIAGNOSIS — F418 Other specified anxiety disorders: Secondary | ICD-10-CM
